# Patient Record
Sex: Female | Born: 1990
Health system: Southern US, Community
[De-identification: ages and names within clinical notes are randomized; demographics above are authoritative.]

## PROBLEM LIST (undated history)

## (undated) DIAGNOSIS — D259 Leiomyoma of uterus, unspecified: Secondary | ICD-10-CM

## (undated) DIAGNOSIS — U071 COVID-19: Secondary | ICD-10-CM

## (undated) DIAGNOSIS — R519 Headache, unspecified: Secondary | ICD-10-CM

## (undated) DIAGNOSIS — N83209 Unspecified ovarian cyst, unspecified side: Secondary | ICD-10-CM

## (undated) DIAGNOSIS — D649 Anemia, unspecified: Secondary | ICD-10-CM

## (undated) DIAGNOSIS — N7011 Chronic salpingitis: Secondary | ICD-10-CM

## (undated) DIAGNOSIS — F419 Anxiety disorder, unspecified: Secondary | ICD-10-CM

## (undated) DIAGNOSIS — R42 Dizziness and giddiness: Secondary | ICD-10-CM

## (undated) DIAGNOSIS — R102 Pelvic and perineal pain: Secondary | ICD-10-CM

## (undated) DIAGNOSIS — D563 Thalassemia minor: Secondary | ICD-10-CM

## (undated) HISTORY — PX: DILATION AND CURETTAGE OF UTERUS: SHX78

## (undated) HISTORY — PX: BREAST CYST ASPIRATION: SHX578

---
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
## 2008-04-12 ENCOUNTER — Emergency Department: Payer: Self-pay | Admitting: Emergency Medicine

## 2008-05-20 ENCOUNTER — Emergency Department: Payer: Self-pay | Admitting: Emergency Medicine

## 2008-05-30 ENCOUNTER — Encounter: Payer: Self-pay | Admitting: Internal Medicine

## 2008-06-05 ENCOUNTER — Encounter: Payer: Self-pay | Admitting: Internal Medicine

## 2008-07-03 ENCOUNTER — Encounter: Payer: Self-pay | Admitting: Internal Medicine

## 2008-08-29 ENCOUNTER — Emergency Department: Payer: Self-pay | Admitting: Emergency Medicine

## 2008-11-07 ENCOUNTER — Emergency Department: Payer: Self-pay | Admitting: Internal Medicine

## 2008-12-21 ENCOUNTER — Emergency Department: Payer: Self-pay | Admitting: Emergency Medicine

## 2009-01-08 ENCOUNTER — Emergency Department: Payer: Self-pay | Admitting: Emergency Medicine

## 2009-01-28 ENCOUNTER — Emergency Department: Payer: Self-pay | Admitting: Emergency Medicine

## 2009-06-22 ENCOUNTER — Emergency Department: Payer: Self-pay | Admitting: Emergency Medicine

## 2009-06-24 ENCOUNTER — Emergency Department: Payer: Self-pay | Admitting: Unknown Physician Specialty

## 2009-07-25 ENCOUNTER — Emergency Department: Payer: Self-pay | Admitting: Emergency Medicine

## 2009-08-16 ENCOUNTER — Ambulatory Visit: Payer: Self-pay | Admitting: Unknown Physician Specialty

## 2010-08-01 ENCOUNTER — Emergency Department: Payer: Self-pay | Admitting: Emergency Medicine

## 2012-10-25 ENCOUNTER — Observation Stay: Payer: Self-pay | Admitting: Obstetrics and Gynecology

## 2012-10-28 ENCOUNTER — Ambulatory Visit: Payer: Self-pay | Admitting: Advanced Practice Midwife

## 2013-02-08 ENCOUNTER — Inpatient Hospital Stay: Payer: Self-pay | Admitting: Obstetrics and Gynecology

## 2013-02-08 LAB — CBC WITH DIFFERENTIAL/PLATELET
Basophil %: 0.3 %
Eosinophil %: 0.9 %
HCT: 39.1 % (ref 35.0–47.0)
Lymphocyte #: 2.5 10*3/uL (ref 1.0–3.6)
MCV: 93 fL (ref 80–100)
Neutrophil %: 69.3 %
Platelet: 257 10*3/uL (ref 150–440)
RBC: 4.23 10*6/uL (ref 3.80–5.20)
RDW: 14.3 % (ref 11.5–14.5)

## 2013-02-09 LAB — HEMATOCRIT: HCT: 31.9 % — ABNORMAL LOW (ref 35.0–47.0)

## 2014-09-12 NOTE — H&P (Signed)
L&D Evaluation:  History:  HPI Pt is a 24 yo G2P0010 at 38.[redacted] weeks GA based on LMP who presents with ctx that started at midnight and awoke her from her sleep. She denies vb or lof, and reports +FM. She recieves her care from [facility] which is significant for late entry to care, obesity, trichomoniasis in pregnancy, smoker. She is O+, VI, RI, GBS+, and recieved her TDaP this pregnancy.   Presents with contractions   Patient's Medical History other  hx of chlamydia, PID   Patient's Surgical History D&C   Medications Pre Natal Vitamins   Allergies PCN, Sulfa, other, latex, pickles   Social History tobacco   Family History Non-Contributory   ROS:  ROS All systems were reviewed.  HEENT, CNS, GI, GU, Respiratory, CV, Renal and Musculoskeletal systems were found to be normal.   Exam:  Vital Signs stable   General no apparent distress   Mental Status clear   Chest clear   Heart normal sinus rhythm   Abdomen gravid, tender with contractions   Back no CVAT   Mebranes Intact   FHT normal rate with no decels, 135-140   Ucx regular, 2 min   Skin dry, no lesions   Lymph no lymphadenopathy   Impression:  Impression IUP at 38.2, R/O labor   Plan:  Plan EFM/NST, monitor contractions and for cervical change   Electronic Signatures: Jannet MantisSubudhi, Priscillia Fouch (CNM)  (Signed 07-Oct-14 02:24)  Authored: L&D Evaluation   Last Updated: 07-Oct-14 02:24 by Jannet MantisSubudhi, Rayquon Uselman (CNM)

## 2015-06-29 ENCOUNTER — Encounter: Payer: Self-pay | Admitting: Emergency Medicine

## 2015-06-29 ENCOUNTER — Emergency Department
Admission: EM | Admit: 2015-06-29 | Discharge: 2015-06-29 | Disposition: A | Discharge status: Home / Self Care | Payer: BLUE CROSS/BLUE SHIELD | Attending: Student | Admitting: Student

## 2015-06-29 ENCOUNTER — Emergency Department: Payer: BLUE CROSS/BLUE SHIELD

## 2015-06-29 DIAGNOSIS — W540XXA Bitten by dog, initial encounter: Secondary | ICD-10-CM | POA: Diagnosis not present

## 2015-06-29 DIAGNOSIS — Y998 Other external cause status: Secondary | ICD-10-CM | POA: Diagnosis not present

## 2015-06-29 DIAGNOSIS — L03021 Acute lymphangitis of right finger: Secondary | ICD-10-CM

## 2015-06-29 DIAGNOSIS — F17210 Nicotine dependence, cigarettes, uncomplicated: Secondary | ICD-10-CM | POA: Insufficient documentation

## 2015-06-29 DIAGNOSIS — Y9389 Activity, other specified: Secondary | ICD-10-CM | POA: Insufficient documentation

## 2015-06-29 DIAGNOSIS — Z880 Allergy status to penicillin: Secondary | ICD-10-CM | POA: Diagnosis not present

## 2015-06-29 DIAGNOSIS — Y9289 Other specified places as the place of occurrence of the external cause: Secondary | ICD-10-CM | POA: Diagnosis not present

## 2015-06-29 DIAGNOSIS — S61051A Open bite of right thumb without damage to nail, initial encounter: Secondary | ICD-10-CM | POA: Insufficient documentation

## 2015-06-29 MED ORDER — DOXYCYCLINE HYCLATE 100 MG PO TABS
100.0000 mg | ORAL_TABLET | Freq: Once | ORAL | Status: AC
  Administered 2015-06-29: 100 mg via ORAL
  Filled 2015-06-29: qty 1

## 2015-06-29 MED ORDER — CLINDAMYCIN HCL 300 MG PO CAPS: 300.0000 mg | ORAL_CAPSULE | Freq: Three times a day (TID) | ORAL | Status: DC

## 2015-06-29 MED ORDER — DOXYCYCLINE HYCLATE 100 MG PO CAPS: 100.0000 mg | ORAL_CAPSULE | Freq: Two times a day (BID) | ORAL | Status: DC

## 2015-06-29 MED ORDER — HYDROCODONE-ACETAMINOPHEN 5-325 MG PO TABS: 1.0000 | ORAL_TABLET | ORAL | Status: DC | PRN

## 2015-06-29 MED ORDER — CLINDAMYCIN PHOSPHATE 900 MG/6ML IJ SOLN
600.0000 mg | Freq: Once | INTRAMUSCULAR | Status: AC
  Administered 2015-06-29: 600 mg via INTRAMUSCULAR
  Filled 2015-06-29: qty 6

## 2015-06-29 NOTE — ED Notes (Signed)
Pt states that she was syringe feeding a 4 day old puppy that's tooth penetrated her rt thumb pad, pt states it occurred on wed and states that it cont to swell and hurt. Pt has some redness radiating up her rt wrist.

## 2015-06-29 NOTE — ED Provider Notes (Signed)
[facility] Emergency Department Provider Note ____________________________________________  Time seen: Approximately 4:43 PM  I have reviewed the triage vital signs and the nursing notes.   HISTORY  Chief Complaint Animal Bite   HPI NICKI FURLAN is a 25 y.o. female who presents to the emergency department for evaluation of right thumb pain and swelling. She states that she was syringe feeding a 57-day-old puppy when he accidentally hit her right thumb. The incident occurred on Wednesday and it has continued to swell and the pain has increased. She has not taken anything for pain.  History reviewed. No pertinent past medical history.  There are no active problems to display for this patient.   History reviewed. No pertinent past surgical history.  Current Outpatient Rx  Name  Route  Sig  Dispense  Refill  . clindamycin (CLEOCIN) 300 MG capsule   Oral   Take 1 capsule (300 mg total) by mouth 3 (three) times daily.   30 capsule   0   . doxycycline (VIBRAMYCIN) 100 MG capsule   Oral   Take 1 capsule (100 mg total) by mouth 2 (two) times daily.   20 capsule   0   . HYDROcodone-acetaminophen (NORCO/VICODIN) 5-325 MG tablet   Oral   Take 1 tablet by mouth every 4 (four) hours as needed for moderate pain.   20 tablet   0     Allergies Sulfa antibiotics and Penicillins  No family history on file.  Social History Social History  Substance Use Topics  . Smoking status: Current Every Day Smoker -- 0.50 packs/day    Types: Cigarettes  . Smokeless tobacco: None  . Alcohol Use: Yes     Comment: occas.     Review of Systems   Constitutional: No fever/chills Gastrointestinal:  No nausea, no vomiting.  No diarrhea. Musculoskeletal: Positive for right thumb pain Skin: Positive for swelling and erythema of the right thumb. Neurological: Negative for headaches, focal weakness or  numbness ____________________________________________   PHYSICAL EXAM:  VITAL SIGNS: ED Triage Vitals  Enc Vitals Group     BP 06/29/15 1559 120/74 mmHg     Pulse Rate 06/29/15 1559 97     Resp 06/29/15 1559 18     Temp 06/29/15 1559 98.2 F (36.8 C)     Temp Source 06/29/15 1559 Oral     SpO2 06/29/15 1559 100 %     Weight 06/29/15 1555 170 lb (77.111 kg)     Height 06/29/15 1555 5' (1.524 m)     Head Cir --      Peak Flow --      Pain Score 06/29/15 1555 0     Pain Loc --      Pain Edu? --      Excl. in GC? --     Constitutional: Alert and oriented. Well appearing and in no acute distress. Eyes: Conjunctivae are normal. PERRL. EOMI Cardiovascular: Good peripheral circulation. Thumb pad blanches. Respiratory: Normal respiratory effort.  No retractions.  Musculoskeletal: Limited movement of MCP and PIP due to swelling and pain. Neurologic:  Normal speech and language. No gross focal neurologic deficits are appreciated. Speech is normal. No gait instability. Skin: Early lymphangitis noted on the right proximal thumb; Negative for petechiae.  Psychiatric: Mood and affect are normal. Speech and behavior are normal.  ____________________________________________   LABS (all labs ordered are listed, but only abnormal results are displayed)  Labs Reviewed - No data to display ____________________________________________  EKG  ____________________________________________  RADIOLOGY  Right thumb negative for acute bony abnormality or retained foreign body per radiology. ____________________________________________   PROCEDURES  Procedure(s) performed:  ____________________________________________   INITIAL IMPRESSION / ASSESSMENT AND PLAN / ED COURSE  Pertinent labs & imaging results that were available during my care of the patient were reviewed by me and considered in my medical decision making (see chart for details).  IM Clindamycin and PO doxycycline  given while in the emergency department. Patient is PCN allergic. She was strongly advised to take the antibiotics as directed and until finished. She was advised that finger infections have poor long term outcomes if noncompliant with medications. She was advised to return to the ER if she is not improving over the next 48 hours. She was advised to return sooner if symptoms worsen. She is to follow up with orthopedics as well. Patient verbalizes understanding and agrees to the plan. ____________________________________________   FINAL CLINICAL IMPRESSION(S) / ED DIAGNOSES  Final diagnoses:  Animal bite of thumb, right, initial encounter  Acute lymphangitis of finger of right hand       Chinita Pester, FNP 06/29/15 1849  Gayla Doss, MD 06/30/15 [id number]

## 2015-06-29 NOTE — ED Notes (Signed)
Pharmacy notified to send up clindamycin

## 2015-06-29 NOTE — Discharge Instructions (Signed)
Animal Bite °Animal bites can range from mild to serious. An animal bite can result in a scratch on the skin, a deep open cut, a puncture of the skin, a crush injury, or tearing away of the skin or a body part. A small bite from a house pet will usually not cause serious problems. However, some animal bites can become infected or injure a bone or other tissue.  °Bites from certain animals can be more dangerous because of the risk of spreading rabies, which is a serious viral infection. This risk is higher with bites from stray animals or wild animals, such as raccoons, foxes, skunks, and bats. Dogs are responsible for most animal bites. Children are bitten more often than adults. °SYMPTOMS  °Common symptoms of an animal bite include:  °· Pain.   °· Bleeding.   °· Swelling.   °· Bruising.   °DIAGNOSIS  °This condition may be diagnosed based on a physical exam and medical history. Your health care provider will examine the wound and ask for details about the animal and how the bite happened. You may also have tests, such as:  °· Blood tests to check for infection or to determine if surgery is needed. °· X-rays to check for damage to bones or joints. °· Culture test. This uses a sample of fluid from the wound to check for infection. °TREATMENT  °Treatment varies depending on the location and type of animal bite and your medical history. Treatment may include:  °· Wound care. This often includes cleaning the wound, flushing the wound with saline solution, and applying a bandage (dressing). Sometimes, the wound is left open to heal because of the high risk of infection. However, in some cases, the wound may be closed with stitches (sutures), staples, skin glue, or adhesive strips.   °· Antibiotic medicine.   °· Tetanus shot.   °· Rabies treatment if the animal could have rabies.   °In some cases, bites that have become infected may require IV antibiotics and surgical treatment in the hospital.  °HOME CARE  INSTRUCTIONS °Wound Care  °· Follow instructions from your health care provider about how to take care of your wound. Make sure you: °¨ Wash your hands with soap and water before you change your dressing. If soap and water are not available, use hand sanitizer. °¨ Change your dressing as told by your health care provider. °¨ Leave sutures, skin glue, or adhesive strips in place. These skin closures may need to be in place for 2 weeks or longer. If adhesive strip edges start to loosen and curl up, you may trim the loose edges. Do not remove adhesive strips completely unless your health care provider tells you to do that. °· Check your wound every day for signs of infection. Watch for:   °¨  Increasing redness, swelling, or pain.   °¨  Fluid, blood, or pus.   °General Instructions  °· Take or apply over-the-counter and prescription medicines only as told by your health care provider.   °· If you were prescribed an antibiotic, take or apply it as told by your health care provider. Do not stop using the antibiotic even if your condition improves.   °· Keep the injured area raised (elevated) above the level of your heart while you are sitting or lying down, if this is possible.   °· If directed, apply ice to the injured area.   °¨  Put ice in a plastic bag.   °¨  Place a towel between your skin and the bag.   °¨  Leave the ice on for 20 minutes, 2-3 times per day.   °·   Keep all follow-up visits as told by your health care provider. This is important.  SEEK MEDICAL CARE IF:  You have increasing redness, swelling, or pain at the site of your wound.   You have a general feeling of sickness (malaise).   You feel nauseous or you vomit.   You have pain that does not get better.  SEEK IMMEDIATE MEDICAL CARE IF:  You have a red streak extending away from your wound.   You have fluid, blood, or pus coming from your wound.   You have a fever or chills.   You have trouble moving your injured area.   You  have numbness or tingling extending beyond the wound.   This information is not intended to replace advice given to you by your health care provider. Make sure you discuss any questions you have with your health care provider.   Document Released: 01/07/2011 Document Revised: 01/10/2015 Document Reviewed: 09/06/2014 Elsevier Interactive Patient Education 2016 [employer]  Fingertip Infection When an infection is around the nail, it is called a paronychia. When it appears over the tip of the finger, it is called a felon. These infections are due to minor injuries or cracks in the skin. If they are not treated properly, they can lead to bone infection and permanent damage to the fingernail. Incision and drainage is necessary if a pus pocket (an abscess) has formed. Antibiotics and pain medicine may also be needed. Keep your hand elevated for the next 2-3 days to reduce swelling and pain. If a pack was placed in the abscess, it should be removed in 1-2 days by your caregiver. Soak the finger in warm water for 20 minutes 4 times daily to help promote drainage. Keep the hands as dry as possible. Wear protective gloves with cotton liners. See your caregiver for follow-up care as recommended.  HOME CARE INSTRUCTIONS   Keep wound clean, dry and dressed as suggested by your caregiver.  Soak in warm salt water for fifteen minutes, four times per day for bacterial infections.  Your caregiver will prescribe an antibiotic if a bacterial infection is suspected. Take antibiotics as directed and finish the prescription, even if the problem appears to be improving before the medicine is gone.  Only take over-the-counter or prescription medicines for pain, discomfort, or fever as directed by your caregiver. SEEK IMMEDIATE MEDICAL CARE IF:  There is redness, swelling, or increasing pain in the wound.  Pus or any other unusual drainage is coming from the wound.  An unexplained oral temperature above 102 F  (38.9 C) develops.  You notice a foul smell coming from the wound or dressing. MAKE SURE YOU:   Understand these instructions.  Monitor your condition.  Contact your caregiver if you are getting worse or not improving.   This information is not intended to replace advice given to you by your health care provider. Make sure you discuss any questions you have with your health care provider.   Document Released: 05/29/2004 Document Revised: 07/14/2011 Document Reviewed: 10/09/2014 Elsevier Interactive Patient Education [employer]

## 2015-06-29 NOTE — ED Notes (Signed)
Pt states she was feeding her puppy with a syringe, puppy bit first finger on right hand, thumb appears swollen and red.

## 2015-06-29 NOTE — ED Notes (Signed)
Pt tolerated IM clindamycin without difficulty.

## 2015-07-02 ENCOUNTER — Emergency Department
Admission: EM | Admit: 2015-07-02 | Discharge: 2015-07-02 | Disposition: A | Discharge status: Home / Self Care | Payer: BLUE CROSS/BLUE SHIELD | Attending: Emergency Medicine | Admitting: Emergency Medicine

## 2015-07-02 ENCOUNTER — Encounter: Payer: Self-pay | Admitting: Emergency Medicine

## 2015-07-02 DIAGNOSIS — F17210 Nicotine dependence, cigarettes, uncomplicated: Secondary | ICD-10-CM | POA: Insufficient documentation

## 2015-07-02 DIAGNOSIS — Z79899 Other long term (current) drug therapy: Secondary | ICD-10-CM | POA: Insufficient documentation

## 2015-07-02 DIAGNOSIS — Z792 Long term (current) use of antibiotics: Secondary | ICD-10-CM | POA: Diagnosis not present

## 2015-07-02 DIAGNOSIS — W540XXA Bitten by dog, initial encounter: Secondary | ICD-10-CM

## 2015-07-02 DIAGNOSIS — Z4801 Encounter for change or removal of surgical wound dressing: Secondary | ICD-10-CM | POA: Diagnosis not present

## 2015-07-02 DIAGNOSIS — Z5189 Encounter for other specified aftercare: Secondary | ICD-10-CM

## 2015-07-02 DIAGNOSIS — W540XXD Bitten by dog, subsequent encounter: Secondary | ICD-10-CM | POA: Diagnosis not present

## 2015-07-02 DIAGNOSIS — Z880 Allergy status to penicillin: Secondary | ICD-10-CM | POA: Diagnosis not present

## 2015-07-02 DIAGNOSIS — S61051D Open bite of right thumb without damage to nail, subsequent encounter: Secondary | ICD-10-CM | POA: Diagnosis present

## 2015-07-02 NOTE — Discharge Instructions (Signed)

## 2015-07-02 NOTE — ED Notes (Signed)
Thumb appears infected x 5 days, tried to drain with needle herself yesterday.

## 2015-07-02 NOTE — ED Provider Notes (Signed)
[facility] Emergency Department Provider Note  ____________________________________________  Time seen: Approximately 1:04 PM  I have reviewed the triage vital signs and the nursing notes.   HISTORY  Chief Complaint Finger Injury    HPI Virginia Mitchell is a 25 y.o. female who presents with swelling of right thumb secondary to a dog bite. The patient got bit on Wednesday and was seen in the ED on 06/30/15. She was given oral clindamycin and doxycycline at that time. The patient has been taking the medications appropriately. She noticed increased swelling and erythema starting last night. She states that the swelling and tenderness is now extending to the proximal aspect of her thumb. She denies radiation of pain to the wrist and fingers. The patient describes the pain as a constant throbbing and ranks the severity to be a 7/10. She poked the site with a needle which released pus. The patient also notes associated tingling and itching when the area is touched. She denies nausea, vomiting, fever and chills. She only took percocet twice but has been instead taking ibuprofen with minimal relief.  History reviewed. No pertinent past medical history.  There are no active problems to display for this patient.   History reviewed. No pertinent past surgical history.  Current Outpatient Rx  Name  Route  Sig  Dispense  Refill  . clindamycin (CLEOCIN) 300 MG capsule   Oral   Take 300 mg by mouth 3 (three) times daily.         . clindamycin (CLEOCIN) 300 MG capsule   Oral   Take 1 capsule (300 mg total) by mouth 3 (three) times daily.   30 capsule   0   . doxycycline (VIBRAMYCIN) 100 MG capsule   Oral   Take 1 capsule (100 mg total) by mouth 2 (two) times daily.   20 capsule   0   . HYDROcodone-acetaminophen (NORCO/VICODIN) 5-325 MG tablet   Oral   Take 1 tablet by mouth every 4 (four) hours as needed for moderate pain.   20 tablet   0      Allergies Sulfa antibiotics and Penicillins  No family history on file.  Social History Social History  Substance Use Topics  . Smoking status: Current Every Day Smoker -- 0.50 packs/day    Types: Cigarettes  . Smokeless tobacco: None  . Alcohol Use: Yes     Comment: occas.     Review of Systems Constitutional: No fever/chills Gastrointestinal: No abdominal pain.  No nausea, no vomiting.  No diarrhea.  Musculoskeletal: Right thumb pain Skin: Swelling of thumb Neurological: Negative for headaches, focal weakness or numbness. 10-point ROS otherwise negative.  ____________________________________________   PHYSICAL EXAM:  VITAL SIGNS: ED Triage Vitals  Enc Vitals Group     BP 07/02/15 1055 124/79 mmHg     Pulse Rate 07/02/15 1055 83     Resp 07/02/15 1055 18     Temp 07/02/15 1055 98.3 F (36.8 C)     Temp Source 07/02/15 1055 Oral     SpO2 07/02/15 1055 99 %     Weight 07/02/15 1055 170 lb (77.111 kg)     Height 07/02/15 1055 5' (1.524 m)     Head Cir --      Peak Flow --      Pain Score 07/02/15 1056 5     Pain Loc --      Pain Edu? --      Excl. in GC? --  Constitutional: Alert and oriented. Well appearing and in no acute distress. Head: Atraumatic. Cardiovascular: Normal rate, regular rhythm. Grossly normal heart sounds.   Respiratory: Normal respiratory effort.  No retractions. Lungs CTAB. Musculoskeletal: Limited ROM of right first MCP and PIP due to swelling. Capillary refill 1-2 sec. Tingling with palpation of thumb.  Neurologic:  Normal speech and language. No gross focal neurologic deficits are appreciated. No gait instability. Skin:  Erythema and swelling of right thumb Psychiatric: Mood and affect are normal. Speech and behavior are normal.  ____________________________________________   LABS (all labs ordered are listed, but only abnormal results are displayed)  Labs Reviewed - No data to  display ____________________________________________  EKG   ____________________________________________  RADIOLOGY   ____________________________________________   PROCEDURES  Procedure(s) performed: None  Critical Care performed: No  ____________________________________________   INITIAL IMPRESSION / ASSESSMENT AND PLAN / ED COURSE  Pertinent labs & imaging results that were available during my care of the patient were reviewed by me and considered in my medical decision making (see chart for details).  Patient presents to emergency department for recheck of a dog bite to her right thumb. Patient is on doxycycline and clindamycin for same. She has a allergy to penicillin based antibiotics. Patient states that symptoms have been maintaining since her last visit. She still endorses some erythema, edema, and pain to the digit. She denies that it was worsening but states that it did not appear to be vastly improving on antibiotics. While the patient was here she noticed an area that "felt like fluid underneath". On further provider she "picked at it with her fingernail" and ruptured the skin. She expressed a minimal amount of purulent material. She states that the pain as well as the swelling appears to improve here in the emergency department. Due to this, no further antibiotics and no change in antibiotics is warranted. Patient's exam is reassuring. There is no firmness or fluctuance noted to palpation at this time. Patient is given strict ED precautions to return for any worsening of symptoms. Patient verbalizes understanding of treatment plan and verbalizes compliance with same. ____________________________________________   FINAL CLINICAL IMPRESSION(S) / ED DIAGNOSES  Final diagnoses:  Visit for wound check  Dog bite      Racheal Patches, PA-C 07/02/15 1509  Sharman Cheek, MD 07/02/15 [phone]

## 2015-07-02 NOTE — ED Notes (Signed)
Right thumb swollen and tender.. Was seen on 2/24 for same ..placed on antibiotics at that time

## 2015-07-10 ENCOUNTER — Emergency Department
Admission: EM | Admit: 2015-07-10 | Discharge: 2015-07-10 | Disposition: A | Discharge status: Home / Self Care | Payer: BLUE CROSS/BLUE SHIELD | Attending: Emergency Medicine | Admitting: Emergency Medicine

## 2015-07-10 ENCOUNTER — Encounter: Payer: Self-pay | Admitting: Emergency Medicine

## 2015-07-10 DIAGNOSIS — Z792 Long term (current) use of antibiotics: Secondary | ICD-10-CM | POA: Diagnosis not present

## 2015-07-10 DIAGNOSIS — F17210 Nicotine dependence, cigarettes, uncomplicated: Secondary | ICD-10-CM | POA: Insufficient documentation

## 2015-07-10 DIAGNOSIS — R05 Cough: Secondary | ICD-10-CM | POA: Diagnosis present

## 2015-07-10 DIAGNOSIS — Z880 Allergy status to penicillin: Secondary | ICD-10-CM | POA: Diagnosis not present

## 2015-07-10 DIAGNOSIS — B349 Viral infection, unspecified: Secondary | ICD-10-CM | POA: Diagnosis not present

## 2015-07-10 NOTE — Discharge Instructions (Signed)
1. Alternate Tylenol and Motrin every 4 hours as needed for fever greater than 100.85F. 2. Drink plenty of fluids daily. 3. Return to the ER for worsened symptoms, persistent vomiting, difficulty breathing or other concerns.  Viral Infections A viral infection can be caused by different types of viruses.Most viral infections are not serious and resolve on their own. However, some infections may cause severe symptoms and may lead to further complications. SYMPTOMS Viruses can frequently cause:  Minor sore throat.  Aches and pains.  Headaches.  Runny nose.  Different types of rashes.  Watery eyes.  Tiredness.  Cough.  Loss of appetite.  Gastrointestinal infections, resulting in nausea, vomiting, and diarrhea. These symptoms do not respond to antibiotics because the infection is not caused by bacteria. However, you might catch a bacterial infection following the viral infection. This is sometimes called a "superinfection." Symptoms of such a bacterial infection may include:  Worsening sore throat with pus and difficulty swallowing.  Swollen neck glands.  Chills and a high or persistent fever.  Severe headache.  Tenderness over the sinuses.  Persistent overall ill feeling (malaise), muscle aches, and tiredness (fatigue).  Persistent cough.  Yellow, green, or brown mucus production with coughing. HOME CARE INSTRUCTIONS   Only take over-the-counter or prescription medicines for pain, discomfort, diarrhea, or fever as directed by your caregiver.  Drink enough water and fluids to keep your urine clear or pale yellow. Sports drinks can provide valuable electrolytes, sugars, and hydration.  Get plenty of rest and maintain proper nutrition. Soups and broths with crackers or rice are fine. SEEK IMMEDIATE MEDICAL CARE IF:   You have severe headaches, shortness of breath, chest pain, neck pain, or an unusual rash.  You have uncontrolled vomiting, diarrhea, or you are unable  to keep down fluids.  You or your child has an oral temperature above 102 F (38.9 C), not controlled by medicine.  Your baby is older than 3 months with a rectal temperature of 102 F (38.9 C) or higher.  Your baby is 723 months old or younger with a rectal temperature of 100.4 F (38 C) or higher. MAKE SURE YOU:   Understand these instructions.  Will watch your condition.  Will get help right away if you are not doing well or get worse.   This information is not intended to replace advice given to you by your health care provider. Make sure you discuss any questions you have with your health care provider.   Document Released: 01/29/2005 Document Revised: 07/14/2011 Document Reviewed: 09/27/2014 Elsevier Interactive Patient Education [employer]

## 2015-07-10 NOTE — ED Provider Notes (Signed)
[facility] Emergency Department Provider Note  ____________________________________________  Time seen: Approximately 6:14 AM  I have reviewed the triage vital signs and the nursing notes.   HISTORY  Chief Complaint Cough and Nasal Congestion    HPI Virginia Mitchell is a 25 y.o. female who presents to the ED from home with a chief complaint of fever, cough, congestion. She reports onset of symptoms 2 days ago with cough productive of slightly yellow sputum, nasal congestion, fever. Her toddler-aged son is also a patient in the ED with similar symptoms.Denies associated chest pain, shortness of breath, abdominal pain, nausea, vomiting, diarrhea. Denies recent travel or trauma. Nothing makes her symptoms better or worse.   Past medical history None  There are no active problems to display for this patient.   History reviewed. No pertinent past surgical history.  Current Outpatient Rx  Name  Route  Sig  Dispense  Refill  . clindamycin (CLEOCIN) 300 MG capsule   Oral   Take 1 capsule (300 mg total) by mouth 3 (three) times daily.   30 capsule   0   . clindamycin (CLEOCIN) 300 MG capsule   Oral   Take 300 mg by mouth 3 (three) times daily.         Marland Kitchen doxycycline (VIBRAMYCIN) 100 MG capsule   Oral   Take 1 capsule (100 mg total) by mouth 2 (two) times daily.   20 capsule   0   . HYDROcodone-acetaminophen (NORCO/VICODIN) 5-325 MG tablet   Oral   Take 1 tablet by mouth every 4 (four) hours as needed for moderate pain.   20 tablet   0     Allergies Sulfa antibiotics and Penicillins  No family history on file.  Social History Social History  Substance Use Topics  . Smoking status: Current Every Day Smoker -- 0.50 packs/day    Types: Cigarettes  . Smokeless tobacco: None  . Alcohol Use: Yes     Comment: occas.     Review of Systems  Constitutional: Positive for fever/chills Eyes: No visual changes. ENT: Positive for nasal  congestion. No sore throat. Cardiovascular: Denies chest pain. Respiratory: Positive for productive cough. Denies shortness of breath. Gastrointestinal: No abdominal pain.  No nausea, no vomiting.  No diarrhea.  No constipation. Genitourinary: Negative for dysuria. Musculoskeletal: Negative for back pain. Skin: Negative for rash. Neurological: Negative for headaches, focal weakness or numbness.  10-point ROS otherwise negative.  ____________________________________________   PHYSICAL EXAM:  VITAL SIGNS: ED Triage Vitals  Enc Vitals Group     BP 07/10/15 0447 115/70 mmHg     Pulse Rate 07/10/15 0447 101     Resp 07/10/15 0447 20     Temp 07/10/15 0447 98.2 F (36.8 C)     Temp Source 07/10/15 0447 Oral     SpO2 07/10/15 0447 95 %     Weight 07/10/15 0447 160 lb (72.576 kg)     Height 07/10/15 0447 5' (1.524 m)     Head Cir --      Peak Flow --      Pain Score --      Pain Loc --      Pain Edu? --      Excl. in GC? --     Constitutional: Alert and oriented. Well appearing and in no acute distress. Eyes: Conjunctivae are normal. PERRL. EOMI. Head: Atraumatic. Nose: Congestion/rhinnorhea. Mouth/Throat: Mucous membranes are moist.  Oropharynx non-erythematous. Neck: No stridor.  Supple neck without meningismus.  Hematological/Lymphatic/Immunilogical: No cervical lymphadenopathy. Cardiovascular: Normal rate, regular rhythm. Grossly normal heart sounds.  Good peripheral circulation. Respiratory: Normal respiratory effort.  No retractions. Lungs CTAB. Gastrointestinal: Soft and nontender. No distention. No abdominal bruits. No CVA tenderness. Musculoskeletal: No lower extremity tenderness nor edema.  No joint effusions. Neurologic:  Normal speech and language. No gross focal neurologic deficits are appreciated. No gait instability. Skin:  Skin is warm, dry and intact. No rash noted. No petechiae. Psychiatric: Mood and affect are normal. Speech and behavior are  normal.  ____________________________________________   LABS (all labs ordered are listed, but only abnormal results are displayed)  Labs Reviewed - No data to display ____________________________________________  EKG  None ____________________________________________  RADIOLOGY  None ____________________________________________   PROCEDURES  Procedure(s) performed: None  Critical Care performed: No  ____________________________________________   INITIAL IMPRESSION / ASSESSMENT AND PLAN / ED COURSE  Pertinent labs & imaging results that were available during my care of the patient were reviewed by me and considered in my medical decision making (see chart for details).  25 year old female who presents with fever, cough, congestion; son with similar symptoms. Son is being tested for influenza. If positive, will start patient on Tamiflu along with her son.  ----------------------------------------- 7:05 AM on 07/10/2015 -----------------------------------------  Son tested negative for influenza A and B. Advised antipyretics as needed for fever and hydration. Strict return precautions given. Patient verbalizes understanding and agrees with plan of care. ____________________________________________   FINAL CLINICAL IMPRESSION(S) / ED DIAGNOSES  Final diagnoses:  Viral syndrome      [name], MD 07/10/15 [id number]

## 2015-07-10 NOTE — ED Notes (Signed)
Patient ambulatory to triage with steady gait, without difficulty or distress noted; pt reports cough/congestion, fever since Saturday; son checking in with same symptoms

## 2016-07-21 ENCOUNTER — Encounter: Payer: Self-pay | Admitting: *Deleted

## 2016-07-21 ENCOUNTER — Emergency Department
Admission: EM | Admit: 2016-07-21 | Discharge: 2016-07-21 | Disposition: A | Discharge status: Home / Self Care | Payer: BLUE CROSS/BLUE SHIELD | Attending: Emergency Medicine | Admitting: Emergency Medicine

## 2016-07-21 DIAGNOSIS — F17210 Nicotine dependence, cigarettes, uncomplicated: Secondary | ICD-10-CM | POA: Diagnosis not present

## 2016-07-21 DIAGNOSIS — N921 Excessive and frequent menstruation with irregular cycle: Secondary | ICD-10-CM | POA: Diagnosis not present

## 2016-07-21 DIAGNOSIS — Z79899 Other long term (current) drug therapy: Secondary | ICD-10-CM | POA: Diagnosis not present

## 2016-07-21 DIAGNOSIS — N939 Abnormal uterine and vaginal bleeding, unspecified: Secondary | ICD-10-CM | POA: Diagnosis present

## 2016-07-21 HISTORY — DX: Anemia, unspecified: D64.9

## 2016-07-21 LAB — POCT PREGNANCY, URINE: Preg Test, Ur: NEGATIVE

## 2016-07-21 LAB — CBC
HEMATOCRIT: 39.4 % (ref 35.0–47.0)
Hemoglobin: 13.2 g/dL (ref 12.0–16.0)
MCH: 30.1 pg (ref 26.0–34.0)
MCHC: 33.5 g/dL (ref 32.0–36.0)
MCV: 89.6 fL (ref 80.0–100.0)
PLATELETS: 396 10*3/uL (ref 150–440)
RBC: 4.4 MIL/uL (ref 3.80–5.20)
RDW: 13.1 % (ref 11.5–14.5)
WBC: 13.7 10*3/uL — AB (ref 3.6–11.0)

## 2016-07-21 MED ORDER — MEDROXYPROGESTERONE ACETATE 10 MG PO TABS: 20.0000 mg | ORAL_TABLET | Freq: Every day | ORAL | 0 refills | Status: DC

## 2016-07-21 NOTE — ED Triage Notes (Signed)
States vaginal bleeding since December, states she has been receiving the depo shot, states Saturday she took a pregnancy test and thought it looked positive, awake and alert in no acute distress

## 2016-07-21 NOTE — ED Provider Notes (Signed)
[facility] Emergency Department Provider Note  ____________________________________________  Time seen: Approximately 4:48 PM  I have reviewed the triage vital signs and the nursing notes.   HISTORY  Chief Complaint Vaginal Bleeding    HPI Virginia Mitchell is a 26 y.o. female who complains of vaginal bleeding for the past 3 or 4 months. It all started about one month after she got her first Depo-Provera injection. The bleeding has continued ever since, she is now had 3 injections. [facility] of the [facility]. Denies any chest pain shortness breath or fever, no dysuria frequency urgency. Did a pregnancy test at home and was concerned it might be positive although she wasn't sure how to interpret because the lines were very faint. No history of fibroids. No trauma. Denies pain. Bleeding is light, using 2-3 pads a day.  No fatigue lightheadedness or syncope   Past Medical History:  Diagnosis Date  . Anemia      There are no active problems to display for this patient.    History reviewed. No pertinent surgical history.   Prior to Admission medications   Medication Sig Start Date End Date Taking? Authorizing Provider  clindamycin (CLEOCIN) 300 MG capsule Take 1 capsule (300 mg total) by mouth 3 (three) times daily. 06/29/15   Chinita Pester, FNP  clindamycin (CLEOCIN) 300 MG capsule Take 300 mg by mouth 3 (three) times daily.    Historical Provider, MD  doxycycline (VIBRAMYCIN) 100 MG capsule Take 1 capsule (100 mg total) by mouth 2 (two) times daily. 06/29/15   Chinita Pester, FNP  HYDROcodone-acetaminophen (NORCO/VICODIN) 5-325 MG tablet Take 1 tablet by mouth every 4 (four) hours as needed for moderate pain. 06/29/15   Chinita Pester, FNP  medroxyPROGESTERone (PROVERA) 10 MG tablet Take 2 tablets (20 mg total) by mouth daily. 07/21/16   Sharman Cheek, MD     Allergies Sulfa antibiotics and Penicillins   No family history on file.  Social  History Social History  Substance Use Topics  . Smoking status: Current Every Day Smoker    Packs/day: 0.50    Types: Cigarettes  . Smokeless tobacco: Never Used  . Alcohol use Yes     Comment: occas.     Review of Systems  Constitutional:   No fever or chills.  ENT:   No sore throat. No rhinorrhea. Cardiovascular:   No chest pain. Respiratory:   No dyspnea or cough. Gastrointestinal:   Negative for abdominal pain, vomiting and diarrhea.  Genitourinary:   Negative for dysuria or difficulty urinating. Musculoskeletal:   Negative for focal pain or swelling Neurological:   Negative for headaches 10-point ROS otherwise negative.  ____________________________________________   PHYSICAL EXAM:  VITAL SIGNS: ED Triage Vitals [07/21/16 1320]  Enc Vitals Group     BP 132/81     Pulse Rate 90     Resp 18     Temp 98.4 F (36.9 C)     Temp Source Oral     SpO2 100 %     Weight 175 lb (79.4 kg)     Height 5' (1.524 m)     Head Circumference      Peak Flow      Pain Score      Pain Loc      Pain Edu?      Excl. in GC?     Vital signs reviewed, nursing assessments reviewed.   Constitutional:   Alert and oriented. Well appearing and in no  distress. Eyes:   No scleral icterus. No conjunctival pallor. PERRL. EOMI.  No nystagmus. ENT   Head:   Normocephalic and atraumatic.   Nose:   No congestion/rhinnorhea. No septal hematoma   Mouth/Throat:   MMM, no pharyngeal erythema. No peritonsillar mass.    Neck:   No stridor. No SubQ emphysema. No meningismus. Hematological/Lymphatic/Immunilogical:   No cervical lymphadenopathy. Cardiovascular:   RRR. Symmetric bilateral radial and DP pulses.  No murmurs.  Respiratory:   Normal respiratory effort without tachypnea nor retractions. Breath sounds are clear and equal bilaterally. No wheezes/rales/rhonchi. Gastrointestinal:   Soft and nontender. Non distended. There is no CVA tenderness.  No rebound, rigidity, or  guarding. Genitourinary:   deferred Musculoskeletal:   Normal range of motion in all extremities. No joint effusions.  No lower extremity tenderness.  No edema. Neurologic:   Normal speech and language.  CN 2-10 normal. Motor grossly intact. No gross focal neurologic deficits are appreciated.  Skin:    Skin is warm, dry and intact. No rash noted.  No petechiae, purpura, or bullae.  ____________________________________________    LABS (pertinent positives/negatives) (all labs ordered are listed, but only abnormal results are displayed) Labs Reviewed  CBC - Abnormal; Notable for the following:       Result Value   WBC 13.7 (*)    All other components within normal limits  POCT PREGNANCY, URINE  POC URINE PREG, ED   ____________________________________________   EKG    ____________________________________________    RADIOLOGY  No results found.  ____________________________________________   PROCEDURES Procedures  ____________________________________________   INITIAL IMPRESSION / ASSESSMENT AND PLAN / ED COURSE  Pertinent labs & imaging results that were available during my care of the patient were reviewed by me and considered in my medical decision making (see chart for details).  Patient well appearing no acute distress, presents with chronic metrorrhagia related to her Depo-Provera use. For this I think she should try higher dose pulse of Provera for 1 week, and then see how her symptoms do and follow-up with gynecology for further assessment. She is also concerned that she could be pregnant. She brings a picture of her home pregnancy test on her phone, which looks like it is negative. Our test here is also negative. Low suspicion for STI PID TOA torsion. No evidence of any other intra-abdominal process. No hemorrhage or symptomatically anemia. She is hemodynamically stable with normal vital signs. Suitable for outpatient follow-up. Return precautions  given..         ____________________________________________   FINAL CLINICAL IMPRESSION(S) / ED DIAGNOSES  Final diagnoses:  Metrorrhagia      New Prescriptions   MEDROXYPROGESTERONE (PROVERA) 10 MG TABLET    Take 2 tablets (20 mg total) by mouth daily.     Portions of this note were generated with dragon dictation software. Dictation errors may occur despite best attempts at proofreading.    Sharman CheekPhillip Kerryn Tennant, MD 07/21/16 [phone]

## 2016-08-11 ENCOUNTER — Encounter: Payer: BLUE CROSS/BLUE SHIELD | Admitting: Obstetrics and Gynecology

## 2016-08-11 ENCOUNTER — Ambulatory Visit (INDEPENDENT_AMBULATORY_CARE_PROVIDER_SITE_OTHER): Payer: BLUE CROSS/BLUE SHIELD | Admitting: Obstetrics and Gynecology

## 2016-08-11 ENCOUNTER — Encounter: Payer: Self-pay | Admitting: Obstetrics and Gynecology

## 2016-08-11 VITALS — BP 94/63 | HR 98 | Ht 60.0 in | Wt 177.0 lb

## 2016-08-11 DIAGNOSIS — Z3009 Encounter for other general counseling and advice on contraception: Secondary | ICD-10-CM

## 2016-08-11 DIAGNOSIS — N921 Excessive and frequent menstruation with irregular cycle: Secondary | ICD-10-CM | POA: Diagnosis not present

## 2016-08-11 NOTE — Progress Notes (Signed)
HPI:      [name] is a 26 y.o. [id number] who LMP was No LMP recorded. Patient has had an injection.  Subjective:   She presents today After having breakthrough bleeding with Depo-Provera. She has received 3 shots of Depo-Provera and is unhappy with it because she has bled for the last few months spotting almost daily. She has been to the emergency department for this problem. She would like to consider other forms of birth control.    Hx: The following portions of the patient's history were reviewed and updated as appropriate:              She  has a past medical history of Anemia. She  does not have a problem list on file. She  has a past surgical history that includes Dilation and curettage of uterus. Her family history includes Asthma in her paternal grandmother; Diabetes in her paternal grandmother; Migraines in her paternal grandmother. She  reports that she has been smoking Cigarettes.  She has been smoking about 0.50 packs per day. She uses smokeless tobacco. She reports that she drinks alcohol. She reports that she does not use drugs. Current Outpatient Prescriptions on File Prior to Visit  Medication Sig Dispense Refill  . clindamycin (CLEOCIN) 300 MG capsule Take 1 capsule (300 mg total) by mouth 3 (three) times daily. (Patient not taking: Reported on 08/11/2016) 30 capsule 0  . clindamycin (CLEOCIN) 300 MG capsule Take 300 mg by mouth 3 (three) times daily.    Marland Kitchen doxycycline (VIBRAMYCIN) 100 MG capsule Take 1 capsule (100 mg total) by mouth 2 (two) times daily. (Patient not taking: Reported on 08/11/2016) 20 capsule 0  . HYDROcodone-acetaminophen (NORCO/VICODIN) 5-325 MG tablet Take 1 tablet by mouth every 4 (four) hours as needed for moderate pain. (Patient not taking: Reported on 08/11/2016) 20 tablet 0  . medroxyPROGESTERone (PROVERA) 10 MG tablet Take 2 tablets (20 mg total) by mouth daily. (Patient not taking: Reported on 08/11/2016) 14 tablet 0   No current  facility-administered medications on file prior to visit.          Review of Systems:  Review of Systems  Constitutional: Denied constitutional symptoms, night sweats, recent illness, fatigue, fever, insomnia and weight loss.  Eyes: Denied eye symptoms, eye pain, photophobia, vision change and visual disturbance.  Ears/Nose/Throat/Neck: Denied ear, nose, throat or neck symptoms, hearing loss, nasal discharge, sinus congestion and sore throat.  Cardiovascular: Denied cardiovascular symptoms, arrhythmia, chest pain/pressure, edema, exercise intolerance, orthopnea and palpitations.  Respiratory: Denied pulmonary symptoms, asthma, pleuritic pain, productive sputum, cough, dyspnea and wheezing.  Gastrointestinal: Denied, gastro-esophageal reflux, melena, nausea and vomiting.  Genitourinary: See HPI for additional information.  Musculoskeletal: Denied musculoskeletal symptoms, stiffness, swelling, muscle weakness and myalgia.  Dermatologic: Denied dermatology symptoms, rash and scar.  Neurologic: Denied neurology symptoms, dizziness, headache, neck pain and syncope.  Psychiatric: Denied psychiatric symptoms, anxiety and depression.  Endocrine: Denied endocrine symptoms including hot flashes and night sweats.   Meds:   Current Outpatient Prescriptions on File Prior to Visit  Medication Sig Dispense Refill  . clindamycin (CLEOCIN) 300 MG capsule Take 1 capsule (300 mg total) by mouth 3 (three) times daily. (Patient not taking: Reported on 08/11/2016) 30 capsule 0  . clindamycin (CLEOCIN) 300 MG capsule Take 300 mg by mouth 3 (three) times daily.    Marland Kitchen doxycycline (VIBRAMYCIN) 100 MG capsule Take 1 capsule (100 mg total) by mouth 2 (two) times daily. (Patient not taking: Reported on 08/11/2016)  20 capsule 0  . HYDROcodone-acetaminophen (NORCO/VICODIN) 5-325 MG tablet Take 1 tablet by mouth every 4 (four) hours as needed for moderate pain. (Patient not taking: Reported on 08/11/2016) 20 tablet 0  .  medroxyPROGESTERone (PROVERA) 10 MG tablet Take 2 tablets (20 mg total) by mouth daily. (Patient not taking: Reported on 08/11/2016) 14 tablet 0   No current facility-administered medications on file prior to visit.     Objective:     Vitals:   08/11/16 1533  BP: 94/63  Pulse: 98                Assessment:    [id number] There are no active problems to display for this patient.    1. Breakthrough bleeding on depo provera   2. Birth control counseling     Likely progesterone breakthrough bleeding from a diminished endometrial lining.   Plan:            1.  Birth Control I discussed multiple birth control options and methods with the patient.  The risks and benefits of each were reviewed. 2.  We have specifically discussed birth control methods at we'll give her better cycle control. She has chosen to try the IUD next. As her Depo expires in a week we will insert her IUD very soon thereafter. Literature given.       F/U  Return in about 2 weeks (around 08/25/2016). I spent 31 minutes with this patient of which greater than 50% was spent discussing birth control methods, side effect profile of Depo-Provera, management of breakthrough bleeding future workup if necessary.  Elonda Husky, M.D. 08/11/2016 4:39 PM

## 2016-08-12 ENCOUNTER — Encounter: Payer: BLUE CROSS/BLUE SHIELD | Admitting: Obstetrics and Gynecology

## 2016-08-14 ENCOUNTER — Other Ambulatory Visit: Payer: BLUE CROSS/BLUE SHIELD

## 2016-08-26 ENCOUNTER — Ambulatory Visit (INDEPENDENT_AMBULATORY_CARE_PROVIDER_SITE_OTHER): Payer: BLUE CROSS/BLUE SHIELD | Admitting: Obstetrics and Gynecology

## 2016-08-26 ENCOUNTER — Encounter: Payer: Self-pay | Admitting: Obstetrics and Gynecology

## 2016-08-26 VITALS — BP 100/65 | HR 88 | Ht 60.0 in | Wt 172.6 lb

## 2016-08-26 DIAGNOSIS — Z30430 Encounter for insertion of intrauterine contraceptive device: Secondary | ICD-10-CM | POA: Diagnosis not present

## 2016-08-26 DIAGNOSIS — N921 Excessive and frequent menstruation with irregular cycle: Secondary | ICD-10-CM

## 2016-08-26 NOTE — Progress Notes (Signed)
HPI:      [name] is a 26 y.o. [id number] who LMP was No LMP recorded. Patient has had an injection.  Subjective:   She presents today for IUD insertion.  Prior to this she was experiencing breakthrough bleeding with Depo-Provera.    Hx: The following portions of the patient's history were reviewed and updated as appropriate:              She  has a past medical history of Anemia. She  does not have a problem list on file. She  has a past surgical history that includes Dilation and curettage of uterus. Her family history includes Asthma in her paternal grandmother; Diabetes in her paternal grandmother; Migraines in her paternal grandmother. She  reports that she has been smoking Cigarettes.  She has been smoking about 0.50 packs per day. She uses smokeless tobacco. She reports that she drinks alcohol. She reports that she does not use drugs. No current outpatient prescriptions on file prior to visit.   No current facility-administered medications on file prior to visit.          Review of Systems:  Review of Systems  Constitutional: Denied constitutional symptoms, night sweats, recent illness, fatigue, fever, insomnia and weight loss.  Eyes: Denied eye symptoms, eye pain, photophobia, vision change and visual disturbance.  Ears/Nose/Throat/Neck: Denied ear, nose, throat or neck symptoms, hearing loss, nasal discharge, sinus congestion and sore throat.  Cardiovascular: Denied cardiovascular symptoms, arrhythmia, chest pain/pressure, edema, exercise intolerance, orthopnea and palpitations.  Respiratory: Denied pulmonary symptoms, asthma, pleuritic pain, productive sputum, cough, dyspnea and wheezing.  Gastrointestinal: Denied, gastro-esophageal reflux, melena, nausea and vomiting.  Genitourinary: Denied genitourinary symptoms including symptomatic vaginal discharge, pelvic relaxation issues, and urinary complaints.  Musculoskeletal: Denied musculoskeletal symptoms, stiffness,  swelling, muscle weakness and myalgia.  Dermatologic: Denied dermatology symptoms, rash and scar.  Neurologic: Denied neurology symptoms, dizziness, headache, neck pain and syncope.  Psychiatric: Denied psychiatric symptoms, anxiety and depression.  Endocrine: Denied endocrine symptoms including hot flashes and night sweats.   Meds:   No current outpatient prescriptions on file prior to visit.   No current facility-administered medications on file prior to visit.     Objective:     Vitals:   08/26/16 0851  BP: 100/65  Pulse: 88    Physical examination   Pelvic:   Vulva: Normal appearance.  No lesions.  Vagina: No lesions or abnormalities noted.  Support: Normal pelvic support.  Urethra No masses tenderness or scarring.  Meatus Normal size without lesions or prolapse.  Cervix: Normal appearance.  No lesions.  Anus: Normal exam.  No lesions.  Perineum: Normal exam.  No lesions.        Bimanual   Uterus: Normal size.  Non-tender.  Mobile.  retroverted  Adnexae: No masses.  Non-tender to palpation.  Cul-de-sac: Negative for abnormality.   IUD Procedure Pt has read the booklet and signed the appropriate forms regarding the Mirena IUD.  All of her questions have been answered.   The cervix was cleansed with betadine solution.  After sounding the uterus and noting the position, the IUD was placed in the usual manner without problem.  The string was cut to the appropriate length.  The patient tolerated the procedure well.              Assessment:    [id number] There are no active problems to display for this patient.    1. Encounter for insertion of mirena IUD  2. Breakthrough bleeding on depo provera       Plan:             F/U  Return in about 4 weeks (around 09/23/2016).  Elonda Husky, M.D. 08/26/2016 9:32 AM

## 2016-09-23 ENCOUNTER — Ambulatory Visit (INDEPENDENT_AMBULATORY_CARE_PROVIDER_SITE_OTHER): Payer: BLUE CROSS/BLUE SHIELD | Admitting: Obstetrics and Gynecology

## 2016-09-23 ENCOUNTER — Encounter: Payer: Self-pay | Admitting: Obstetrics and Gynecology

## 2016-09-23 VITALS — BP 103/66 | HR 80 | Wt 172.6 lb

## 2016-09-23 DIAGNOSIS — Z30431 Encounter for routine checking of intrauterine contraceptive device: Secondary | ICD-10-CM | POA: Diagnosis not present

## 2016-09-23 DIAGNOSIS — Z975 Presence of (intrauterine) contraceptive device: Secondary | ICD-10-CM | POA: Diagnosis not present

## 2016-09-23 DIAGNOSIS — N921 Excessive and frequent menstruation with irregular cycle: Secondary | ICD-10-CM | POA: Diagnosis not present

## 2016-09-23 NOTE — Progress Notes (Signed)
HPI:      [name] is a 26 y.o. [phone] who LMP was No LMP recorded. Patient is not currently having periods (Reason: IUD).  Subjective:   She presents today  For follow-up of her IUD. She reports that she is having daily spotting. She has not had intercourse. She reports no other problems.    Hx: The following portions of the patient's history were reviewed and updated as appropriate:             She  has a past medical history of Anemia. She  does not have a problem list on file. She  has a past surgical history that includes Dilation and curettage of uterus. Her family history includes Asthma in her paternal grandmother; Diabetes in her paternal grandmother; Migraines in her paternal grandmother. She  reports that she has been smoking Cigarettes.  She has been smoking about 0.50 packs per day. She uses smokeless tobacco. She reports that she drinks alcohol. She reports that she does not use drugs. She is allergic to sulfa antibiotics and penicillins.       Review of Systems:  Review of Systems  Constitutional: Denied constitutional symptoms, night sweats, recent illness, fatigue, fever, insomnia and weight loss.  Eyes: Denied eye symptoms, eye pain, photophobia, vision change and visual disturbance.  Ears/Nose/Throat/Neck: Denied ear, nose, throat or neck symptoms, hearing loss, nasal discharge, sinus congestion and sore throat.  Cardiovascular: Denied cardiovascular symptoms, arrhythmia, chest pain/pressure, edema, exercise intolerance, orthopnea and palpitations.  Respiratory: Denied pulmonary symptoms, asthma, pleuritic pain, productive sputum, cough, dyspnea and wheezing.  Gastrointestinal: Denied, gastro-esophageal reflux, melena, nausea and vomiting.  Genitourinary: See HPI for additional information.  Musculoskeletal: Denied musculoskeletal symptoms, stiffness, swelling, muscle weakness and myalgia.  Dermatologic: Denied dermatology symptoms, rash and scar.   Neurologic: Denied neurology symptoms, dizziness, headache, neck pain and syncope.  Psychiatric: Denied psychiatric symptoms, anxiety and depression.  Endocrine: Denied endocrine symptoms including hot flashes and night sweats.   Meds:   No current outpatient prescriptions on file prior to visit.   No current facility-administered medications on file prior to visit.     Objective:     Vitals:   09/23/16 1058  BP: 103/66  Pulse: 80              Physical examination   Pelvic:   Vulva: Normal appearance.  No lesions.  Vagina: No lesions or abnormalities noted.   Small amount of dark brown blood noted  Support: Normal pelvic support.  Urethra No masses tenderness or scarring.  Meatus Normal size without lesions or prolapse.  Cervix: Normal appearance.  No lesions. IUD strings noted at cervical os.  Anus: Normal exam.  No lesions.  Perineum: Normal exam.  No lesions.        Bimanual   Uterus: Normal size.  Non-tender.  Mobile.  AV.  Adnexae: No masses.  Non-tender to palpation.  Cul-de-sac: Negative for abnormality.     Assessment:    [id number] There are no active problems to display for this patient.    1. Surveillance of previously prescribed intrauterine contraceptive device   2. Breakthrough bleeding with IUD        Plan:            1.   Patient willing to wait 2 more weeks with the expectation that spotting will resolve.  If spotting continues consider 1 month of OCPs.    F/U  Return for Annual Physical.  Elonda Huskyavid J. Evans,  M.D. 09/23/2016 11:43 AM

## 2016-10-21 ENCOUNTER — Other Ambulatory Visit: Payer: Self-pay

## 2016-10-21 NOTE — Telephone Encounter (Signed)
Pt contacted- sample box of Lo Loestrin Fe given to pt with instructions to make appt to see provider if after finishing pack she still has bleeding.

## 2016-10-21 NOTE — Telephone Encounter (Signed)
Patient came into the office wanting to get a sample of Birth Control, Patient was told by Dr. Logan BoresEvans that it would be fine to stop by and pick up sample. Patient has had a IUD I"nserted and is currently still bleeding, Patient would like relief. Patient would like to be called to notify if she can come by the office to pick up, but Patient also left a preferred pharmacy if needed [facility] [address] Please advise.

## 2016-10-21 NOTE — Telephone Encounter (Signed)
Pt stopped by the office asking for OCPs. States she has been spotting since IUD insertion and was told  she could have OCPs. Please advise.

## 2016-10-29 ENCOUNTER — Encounter: Payer: Self-pay | Admitting: Obstetrics and Gynecology

## 2016-10-30 ENCOUNTER — Telehealth: Payer: Self-pay | Admitting: Obstetrics and Gynecology

## 2016-10-30 NOTE — Telephone Encounter (Signed)
Patient called to schedule an IUD removal appointment for asap - I have scheduled her for Monday 11/03/2016. She states that the IUD hasn't helped stop the bleeding as all.

## 2016-11-03 ENCOUNTER — Encounter: Payer: Self-pay | Admitting: Obstetrics and Gynecology

## 2016-11-03 ENCOUNTER — Ambulatory Visit (INDEPENDENT_AMBULATORY_CARE_PROVIDER_SITE_OTHER): Payer: BLUE CROSS/BLUE SHIELD | Admitting: Obstetrics and Gynecology

## 2016-11-03 VITALS — BP 101/69 | HR 75 | Ht 60.0 in | Wt 170.2 lb

## 2016-11-03 DIAGNOSIS — Z30432 Encounter for removal of intrauterine contraceptive device: Secondary | ICD-10-CM

## 2016-11-03 DIAGNOSIS — Z975 Presence of (intrauterine) contraceptive device: Secondary | ICD-10-CM | POA: Diagnosis not present

## 2016-11-03 DIAGNOSIS — N921 Excessive and frequent menstruation with irregular cycle: Secondary | ICD-10-CM | POA: Diagnosis not present

## 2016-11-03 NOTE — Progress Notes (Signed)
HPI:      [name] is a 26 y.o. [phone] who LMP was No LMP recorded. Patient is not currently having periods (Reason: IUD).  Subjective:   She presents today With complaint of daily bleeding on IUD. She is understandably not happy with this form of birth control. She states that she is not currently sexually active and does not desire another form of birth control. She is requesting IUD removal.    Hx: The following portions of the patient's history were reviewed and updated as appropriate:             She  has a past medical history of Anemia. She  does not have a problem list on file. She  has a past surgical history that includes Dilation and curettage of uterus. Her family history includes Asthma in her paternal grandmother; Diabetes in her paternal grandmother; Migraines in her paternal grandmother. She  reports that she has been smoking Cigarettes.  She has been smoking about 0.50 packs per day. She uses smokeless tobacco. She reports that she drinks alcohol. She reports that she does not use drugs. She is allergic to sulfa antibiotics and penicillins.       Review of Systems:  Review of Systems  Constitutional: Denied constitutional symptoms, night sweats, recent illness, fatigue, fever, insomnia and weight loss.  Eyes: Denied eye symptoms, eye pain, photophobia, vision change and visual disturbance.  Ears/Nose/Throat/Neck: Denied ear, nose, throat or neck symptoms, hearing loss, nasal discharge, sinus congestion and sore throat.  Cardiovascular: Denied cardiovascular symptoms, arrhythmia, chest pain/pressure, edema, exercise intolerance, orthopnea and palpitations.  Respiratory: Denied pulmonary symptoms, asthma, pleuritic pain, productive sputum, cough, dyspnea and wheezing.  Gastrointestinal: Denied, gastro-esophageal reflux, melena, nausea and vomiting.  Genitourinary: See HPI for additional information.  Musculoskeletal: Denied musculoskeletal symptoms,  stiffness, swelling, muscle weakness and myalgia.  Dermatologic: Denied dermatology symptoms, rash and scar.  Neurologic: Denied neurology symptoms, dizziness, headache, neck pain and syncope.  Psychiatric: Denied psychiatric symptoms, anxiety and depression.  Endocrine: Denied endocrine symptoms including hot flashes and night sweats.   Meds:   No current outpatient prescriptions on file prior to visit.   No current facility-administered medications on file prior to visit.     Objective:     Vitals:   11/03/16 1435  BP: 101/69  Pulse: 75              Physical examination   Pelvic:   Vulva: Normal appearance.  No lesions.  Vagina: No lesions or abnormalities noted.  Support: Normal pelvic support.  Urethra No masses tenderness or scarring.  Meatus Normal size without lesions or prolapse.  Cervix: Normal appearance.  No lesions. IUD strings noted at cervical os.  Anus: Normal exam.  No lesions.  Perineum: Normal exam.  No lesions.        Bimanual   Uterus: Normal size.  Non-tender.  Mobile.  AV.  Adnexae: No masses.  Non-tender to palpation.  Cul-de-sac: Negative for abnormality.   IUD Removal Strings of IUD identified and grasped.  IUD removed without problem.  Pt tolerated this well.  IUD noted to be intact.   Assessment:    [id number] There are no active problems to display for this patient.    1. Encounter for IUD removal   2. Breakthrough bleeding associated with intrauterine device (IUD)        Plan:            1.  Discussed other forms of birth  control. Patient desires she will contact us. Orders No orders of the defined types were placed in this encounter.   No orders of the defined types were placed in this encounter.       F/U  Return for Pt to contact us if symptoms worsen.  Elonda Husky, M.D. 11/03/2016 2:55 PM

## 2017-09-24 ENCOUNTER — Ambulatory Visit (INDEPENDENT_AMBULATORY_CARE_PROVIDER_SITE_OTHER): Payer: BLUE CROSS/BLUE SHIELD | Admitting: Obstetrics and Gynecology

## 2017-09-24 ENCOUNTER — Encounter: Payer: Self-pay | Admitting: Obstetrics and Gynecology

## 2017-09-24 VITALS — BP 99/66 | HR 90 | Ht 60.0 in | Wt 163.2 lb

## 2017-09-24 DIAGNOSIS — Z0000 Encounter for general adult medical examination without abnormal findings: Secondary | ICD-10-CM | POA: Diagnosis not present

## 2017-09-24 NOTE — Progress Notes (Signed)
HPI:      [name] is a 27 y.o. [phone] who LMP was Patient's last menstrual period was 08/25/2017 (approximate).  Subjective:   She presents today for her annual examination.  She has no complaints.  [facility] for a Pap smear which she reports is normal.  She states that she had BV at that time and was treated.  She denies any symptoms at this time.  She is not interested in birth control as her partner has a vasectomy.  She reports normal regular menstrual cycles.    Hx: The following portions of the patient's history were reviewed and updated as appropriate:             She  has a past medical history of Anemia. She does not have a problem list on file. She  has a past surgical history that includes Dilation and curettage of uterus. Her family history includes Asthma in her paternal grandmother; Diabetes in her paternal grandmother; Migraines in her paternal grandmother. She  reports that she has been smoking cigarettes.  She has been smoking about 0.50 packs per day. She uses smokeless tobacco. She reports that she drinks alcohol. She reports that she does not use drugs. She currently has no medications in their medication list. She is allergic to sulfa antibiotics and penicillins.       Review of Systems:  Review of Systems  Constitutional: Denied constitutional symptoms, night sweats, recent illness, fatigue, fever, insomnia and weight loss.  Eyes: Denied eye symptoms, eye pain, photophobia, vision change and visual disturbance.  Ears/Nose/Throat/Neck: Denied ear, nose, throat or neck symptoms, hearing loss, nasal discharge, sinus congestion and sore throat.  Cardiovascular: Denied cardiovascular symptoms, arrhythmia, chest pain/pressure, edema, exercise intolerance, orthopnea and palpitations.  Respiratory: Denied pulmonary symptoms, asthma, pleuritic pain, productive sputum, cough, dyspnea and wheezing.  Gastrointestinal: Denied,  gastro-esophageal reflux, melena, nausea and vomiting.  Genitourinary: Denied genitourinary symptoms including symptomatic vaginal discharge, pelvic relaxation issues, and urinary complaints.  Musculoskeletal: Denied musculoskeletal symptoms, stiffness, swelling, muscle weakness and myalgia.  Dermatologic: Denied dermatology symptoms, rash and scar.  Neurologic: Denied neurology symptoms, dizziness, headache, neck pain and syncope.  Psychiatric: Denied psychiatric symptoms, anxiety and depression.  Endocrine: Denied endocrine symptoms including hot flashes and night sweats.   Meds:   No current outpatient medications on file prior to visit.   No current facility-administered medications on file prior to visit.     Objective:     Vitals:   09/24/17 1031  BP: 99/66  Pulse: 90              Physical examination General NAD, Conversant  HEENT Atraumatic; Op clear with mmm.  Normo-cephalic. Pupils reactive. Anicteric sclerae  Thyroid/Neck Smooth without nodularity or enlargement. Normal ROM.  Neck Supple.  Skin No rashes, lesions or ulceration. Normal palpated skin turgor. No nodularity.  Breasts: No masses or discharge.  Symmetric.  No axillary adenopathy.  Lungs: Clear to auscultation.No rales or wheezes. Normal Respiratory effort, no retractions.  Heart: NSR.  No murmurs or rubs appreciated. No periferal edema  Abdomen: Soft.  Non-tender.  No masses.  No HSM. No hernia  Extremities: Moves all appropriately.  Normal ROM for age. No lymphadenopathy.  Neuro: Oriented to PPT.  Normal mood. Normal affect.     Pelvic:   Vulva: Normal appearance.  No lesions.  Vagina: No lesions or abnormalities noted.  Support: Normal pelvic support.  Urethra No masses  tenderness or scarring.  Meatus Normal size without lesions or prolapse.  Cervix: Normal appearance.  No lesions.  Anus: Normal exam.  No lesions.  Perineum: Normal exam.  No lesions.        Bimanual   Uterus: Normal size.   Non-tender.  Mobile.  AV.  Adnexae: No masses.  Non-tender to palpation.  Cul-de-sac: Negative for abnormality.      Assessment:    [id number] There are no active problems to display for this patient.    1. Encounter for annual physical exam     Patient without complaint or issue.  Exam normal.   Plan:            1.  Patient to contact [place] if her birth control needs change. Orders No orders of the defined types were placed in this encounter.   No orders of the defined types were placed in this encounter.       F/U  Return in about 1 year (around 09/25/2018) for Annual Physical.  Elonda Husky, M.D. 09/24/2017 11:19 AM

## 2018-06-11 ENCOUNTER — Emergency Department
Admission: EM | Admit: 2018-06-11 | Discharge: 2018-06-11 | Disposition: A | Discharge status: Home / Self Care | Payer: No Typology Code available for payment source | Attending: Emergency Medicine | Admitting: Emergency Medicine

## 2018-06-11 ENCOUNTER — Other Ambulatory Visit: Payer: Self-pay

## 2018-06-11 ENCOUNTER — Emergency Department: Payer: No Typology Code available for payment source

## 2018-06-11 DIAGNOSIS — Y999 Unspecified external cause status: Secondary | ICD-10-CM | POA: Insufficient documentation

## 2018-06-11 DIAGNOSIS — F17210 Nicotine dependence, cigarettes, uncomplicated: Secondary | ICD-10-CM | POA: Diagnosis not present

## 2018-06-11 DIAGNOSIS — S161XXA Strain of muscle, fascia and tendon at neck level, initial encounter: Secondary | ICD-10-CM | POA: Diagnosis not present

## 2018-06-11 DIAGNOSIS — Y939 Activity, unspecified: Secondary | ICD-10-CM | POA: Diagnosis not present

## 2018-06-11 DIAGNOSIS — S199XXA Unspecified injury of neck, initial encounter: Secondary | ICD-10-CM | POA: Diagnosis present

## 2018-06-11 DIAGNOSIS — Y9289 Other specified places as the place of occurrence of the external cause: Secondary | ICD-10-CM | POA: Insufficient documentation

## 2018-06-11 MED ORDER — IBUPROFEN 600 MG PO TABS: 600.0000 mg | ORAL_TABLET | Freq: Once | ORAL | Status: DC

## 2018-06-11 MED ORDER — IBUPROFEN 600 MG PO TABS: 600.0000 mg | ORAL_TABLET | Freq: Three times a day (TID) | ORAL | 0 refills | Status: DC | PRN

## 2018-06-11 MED ORDER — CYCLOBENZAPRINE HCL 10 MG PO TABS: 10.0000 mg | ORAL_TABLET | Freq: Once | ORAL | Status: DC

## 2018-06-11 MED ORDER — CYCLOBENZAPRINE HCL 10 MG PO TABS: 10.0000 mg | ORAL_TABLET | Freq: Three times a day (TID) | ORAL | 0 refills | Status: DC | PRN

## 2018-06-11 MED ORDER — TRAMADOL HCL 50 MG PO TABS: 50.0000 mg | ORAL_TABLET | Freq: Two times a day (BID) | ORAL | 0 refills | Status: DC | PRN
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
## 2018-06-16 ENCOUNTER — Encounter: Payer: Self-pay | Admitting: Emergency Medicine

## 2018-06-16 ENCOUNTER — Emergency Department
Admission: EM | Admit: 2018-06-16 | Discharge: 2018-06-16 | Disposition: A | Discharge status: Home / Self Care | Payer: No Typology Code available for payment source | Attending: Emergency Medicine | Admitting: Emergency Medicine

## 2018-06-16 ENCOUNTER — Emergency Department: Payer: No Typology Code available for payment source

## 2018-06-16 ENCOUNTER — Other Ambulatory Visit: Payer: Self-pay

## 2018-06-16 DIAGNOSIS — M545 Low back pain: Secondary | ICD-10-CM | POA: Diagnosis present

## 2018-06-16 DIAGNOSIS — M7918 Myalgia, other site: Secondary | ICD-10-CM | POA: Diagnosis not present

## 2018-06-16 DIAGNOSIS — F17210 Nicotine dependence, cigarettes, uncomplicated: Secondary | ICD-10-CM | POA: Insufficient documentation

## 2018-06-16 LAB — POCT PREGNANCY, URINE: Preg Test, Ur: NEGATIVE
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
## 2018-06-16 NOTE — Discharge Instructions (Addendum)
Follow-up with your primary care provider or can no clinic acute care if any continued problems.  Use ice or heat to your muscles as needed for discomfort.  Continue taking Flexeril, ibuprofen, and tramadol as needed for discomfort.  X-rays today were negative.

## 2019-01-25 ENCOUNTER — Other Ambulatory Visit: Payer: Self-pay

## 2019-01-25 ENCOUNTER — Emergency Department
Admission: EM | Admit: 2019-01-25 | Discharge: 2019-01-25 | Disposition: A | Discharge status: Home / Self Care | Payer: BC Managed Care – PPO | Attending: Emergency Medicine | Admitting: Emergency Medicine

## 2019-01-25 ENCOUNTER — Encounter: Payer: Self-pay | Admitting: Emergency Medicine

## 2019-01-25 DIAGNOSIS — Z20822 Contact with and (suspected) exposure to covid-19: Secondary | ICD-10-CM

## 2019-01-25 DIAGNOSIS — B349 Viral infection, unspecified: Secondary | ICD-10-CM | POA: Insufficient documentation

## 2019-01-25 DIAGNOSIS — F17210 Nicotine dependence, cigarettes, uncomplicated: Secondary | ICD-10-CM | POA: Insufficient documentation

## 2019-01-25 DIAGNOSIS — M791 Myalgia, unspecified site: Secondary | ICD-10-CM | POA: Diagnosis present

## 2019-01-25 DIAGNOSIS — L02411 Cutaneous abscess of right axilla: Secondary | ICD-10-CM | POA: Diagnosis not present

## 2019-01-25 DIAGNOSIS — R509 Fever, unspecified: Secondary | ICD-10-CM | POA: Insufficient documentation

## 2019-01-25 DIAGNOSIS — L02419 Cutaneous abscess of limb, unspecified: Secondary | ICD-10-CM

## 2019-01-25 LAB — POCT PREGNANCY, URINE: Preg Test, Ur: NEGATIVE

## 2019-01-25 MED ORDER — NAPROXEN 500 MG PO TABS: 500.0000 mg | ORAL_TABLET | Freq: Two times a day (BID) | ORAL | Status: DC

## 2019-01-25 MED ORDER — TRAMADOL HCL 50 MG PO TABS: 50.0000 mg | ORAL_TABLET | Freq: Four times a day (QID) | ORAL | 0 refills | Status: DC | PRN

## 2019-01-25 MED ORDER — DOXYCYCLINE MONOHYDRATE 100 MG PO CAPS: 100.0000 mg | ORAL_CAPSULE | Freq: Two times a day (BID) | ORAL | 0 refills | Status: DC

## 2019-01-25 NOTE — ED Triage Notes (Signed)
Also says she has a knot under arm she wants checked.

## 2019-01-25 NOTE — ED Notes (Signed)
See triage note  States she has body aches and subjective fever since yesterday  Also thinks she may have an abscess under her right arm  Denies any n/v/d/ ,cough or sore throat

## 2019-01-25 NOTE — Discharge Instructions (Signed)
Advised to have COVID-19 testing done at the outpatient clinic at this hospital.  Advise of quarantine pending results of COVID-19 test.

## 2019-01-25 NOTE — ED Triage Notes (Signed)
Fever, body aches since yestrday.

## 2019-01-25 NOTE — ED Provider Notes (Signed)
[facility] Emergency Department Provider Note   ____________________________________________   First MD Initiated Contact with Patient 01/25/19 1048     (approximate)  I have reviewed the triage vital signs and the nursing notes.   HISTORY  Chief Complaint Generalized Body Aches and Fever    HPI Mitchell Virginia is a 28 y.o. female patient presents with body aches/fatigue for 2 days.  Patient also states she has a "knot" on her right arm which she would like to have evaluated.  Patient states the knot is tender to palpation.  Patient denies drainage.  Patient rates the pain in the right axillary area is 8/10.  Patient described the pain as "sore".  No palliative measure for both complaints.         Past Medical History:  Diagnosis Date  . Anemia     There are no active problems to display for this patient.   Past Surgical History:  Procedure Laterality Date  . DILATION AND CURETTAGE OF UTERUS      Prior to Admission medications   Medication Sig Start Date End Date Taking? Authorizing Provider  doxycycline (MONODOX) 100 MG capsule Take 1 capsule (100 mg total) by mouth 2 (two) times daily. 01/25/19   Sable Feil, PA-C  naproxen (NAPROSYN) 500 MG tablet Take 1 tablet (500 mg total) by mouth 2 (two) times daily with a meal. 01/25/19   Sable Feil, PA-C  traMADol (ULTRAM) 50 MG tablet Take 1 tablet (50 mg total) by mouth every 6 (six) hours as needed. 01/25/19 01/25/20  Sable Feil, PA-C    Allergies Sulfa antibiotics and Penicillins  Family History  Problem Relation Age of Onset  . Diabetes Paternal Grandmother   . Asthma Paternal Grandmother   . Migraines Paternal Grandmother   . Breast cancer Neg Hx   . Ovarian cancer Neg Hx   . Colon cancer Neg Hx     Social History Social History   Tobacco Use  . Smoking status: Current Every Day Smoker    Packs/day: 0.50    Types: Cigarettes  . Smokeless tobacco: Current User   Substance Use Topics  . Alcohol use: Yes    Comment: occas.   . Drug use: No    Review of Systems Constitutional: No fever/chills.  Fatigue and body aches. Eyes: No visual changes. ENT: No sore throat. Cardiovascular: Denies chest pain. Respiratory: Denies shortness of breath. Gastrointestinal: No abdominal pain.  No nausea, no vomiting.  No diarrhea.  No constipation. Genitourinary: Negative for dysuria. Musculoskeletal: Negative for back pain. Skin: Nodule lesion right axillary area.   Neurological: Negative for headaches, focal weakness or numbness. Allergic/Immunilogical: Penicillin and sulfa.  ____________________________________________   PHYSICAL EXAM:  VITAL SIGNS: ED Triage Vitals  Enc Vitals Group     BP 01/25/19 1038 111/65     Pulse Rate 01/25/19 1038 (!) 102     Resp 01/25/19 1038 18     Temp 01/25/19 1038 98.6 F (37 C)     Temp Source 01/25/19 1038 Oral     SpO2 01/25/19 1038 99 %     Weight 01/25/19 1040 175 lb (79.4 kg)     Height 01/25/19 1040 5' (1.524 m)     Head Circumference --      Peak Flow --      Pain Score 01/25/19 1038 8     Pain Loc --      Pain Edu? --      Excl.  in GC? --    Constitutional: Alert and oriented. Well appearing and in no acute distress. Neck: No stridor.  No cervical spine tenderness to palpation. Hematological/Lymphatic/Immunilogical: No cervical lymphadenopathy. Cardiovascular: Normal rate, regular rhythm. Grossly normal heart sounds.  Good peripheral circulation. Respiratory: Normal respiratory effort.  No retractions. Lungs CTAB. Neurologic:  Normal speech and language. No gross focal neurologic deficits are appreciated. No gait instability. Skin:  Skin is warm, dry and intact.  Nonfluctuant nodule lesion right axillary area. Psychiatric: Mood and affect are normal. Speech and behavior are normal.  ____________________________________________   LABS (all labs ordered are listed, but only abnormal results are  displayed)  Labs Reviewed  POC URINE PREG, ED  POCT PREGNANCY, URINE   ____________________________________________  EKG   ____________________________________________  RADIOLOGY  ED MD interpretation:    Official radiology report(s): No results found.  ____________________________________________   PROCEDURES  Procedure(s) performed (including Critical Care):  Procedures   ____________________________________________   INITIAL IMPRESSION / ASSESSMENT AND PLAN / ED COURSE  As part of my medical decision making, I reviewed the following data within the electronic MEDICAL RECORD NUMBER         Virginia Mitchell was evaluated in Emergency Department on 01/25/2019 for the symptoms described in the history of present illness. She was evaluated in the context of the global COVID-19 pandemic, which necessitated consideration that the patient might be at risk for infection with the SARS-CoV-2 virus that causes COVID-19. Institutional protocols and algorithms that pertain to the evaluation of patients at risk for COVID-19 are in a state of rapid change based on information released by regulatory bodies including the CDC and federal and state organizations. These policies and algorithms were followed during the patient's care in the ED.   Patient presents with nodule lesion right axillary area cyst with abscess.  Area is nonfluctuant.  Discussed rationale for not incised and drained at this time.  Patient also presents with physical findings consistent with viral illness.  Patient advised to have a COVID-19 test done at the outpatient clinic.  Patient advised self quarantine pending results of COVID-19 test.     ____________________________________________   FINAL CLINICAL IMPRESSION(S) / ED DIAGNOSES  Final diagnoses:  Abscess of axilla  Viral syndrome     ED Discharge Orders         Ordered    doxycycline (MONODOX) 100 MG capsule  2 times daily     01/25/19 1116     naproxen (NAPROSYN) 500 MG tablet  2 times daily with meals     01/25/19 1116    traMADol (ULTRAM) 50 MG tablet  Every 6 hours PRN     01/25/19 1116           Note:  This document was prepared using Dragon voice recognition software and may include unintentional dictation errors.    Joni Reining, PA-C 01/25/19 1122    Phineas Semen, MD 01/25/19 [phone]

## 2019-01-26 LAB — NOVEL CORONAVIRUS, NAA: SARS-CoV-2, NAA: NOT DETECTED

## 2019-03-04 ENCOUNTER — Emergency Department: Payer: BC Managed Care – PPO

## 2019-03-04 ENCOUNTER — Other Ambulatory Visit: Payer: Self-pay

## 2019-03-04 ENCOUNTER — Emergency Department
Admission: EM | Admit: 2019-03-04 | Discharge: 2019-03-04 | Disposition: A | Discharge status: Home / Self Care | Payer: BC Managed Care – PPO | Attending: Emergency Medicine | Admitting: Emergency Medicine

## 2019-03-04 ENCOUNTER — Encounter: Payer: Self-pay | Admitting: Emergency Medicine

## 2019-03-04 DIAGNOSIS — Z79899 Other long term (current) drug therapy: Secondary | ICD-10-CM | POA: Insufficient documentation

## 2019-03-04 DIAGNOSIS — O26899 Other specified pregnancy related conditions, unspecified trimester: Secondary | ICD-10-CM

## 2019-03-04 DIAGNOSIS — F17210 Nicotine dependence, cigarettes, uncomplicated: Secondary | ICD-10-CM | POA: Insufficient documentation

## 2019-03-04 DIAGNOSIS — O99891 Other specified diseases and conditions complicating pregnancy: Secondary | ICD-10-CM | POA: Insufficient documentation

## 2019-03-04 DIAGNOSIS — O99331 Smoking (tobacco) complicating pregnancy, first trimester: Secondary | ICD-10-CM | POA: Diagnosis not present

## 2019-03-04 DIAGNOSIS — R1032 Left lower quadrant pain: Secondary | ICD-10-CM | POA: Insufficient documentation

## 2019-03-04 DIAGNOSIS — Z3A01 Less than 8 weeks gestation of pregnancy: Secondary | ICD-10-CM | POA: Diagnosis not present

## 2019-03-04 LAB — COMPREHENSIVE METABOLIC PANEL
ALT: 14 U/L (ref 0–44)
AST: 16 U/L (ref 15–41)
Albumin: 3.8 g/dL (ref 3.5–5.0)
Alkaline Phosphatase: 53 U/L (ref 38–126)
Anion gap: 10 (ref 5–15)
BUN: 10 mg/dL (ref 6–20)
CO2: 23 mmol/L (ref 22–32)
Calcium: 9.6 mg/dL (ref 8.9–10.3)
Chloride: 103 mmol/L (ref 98–111)
Creatinine, Ser: 0.54 mg/dL (ref 0.44–1.00)
GFR calc Af Amer: >60 mL/min (ref >60)
GFR calc non Af Amer: >60 mL/min (ref >60)
Glucose, Bld: 89 mg/dL (ref 70–99)
Potassium: 3.9 mmol/L (ref 3.5–5.1)
Sodium: 136 mmol/L (ref 135–145)
Total Bilirubin: 0.3 mg/dL (ref 0.3–1.2)
Total Protein: 6.9 g/dL (ref 6.5–8.1)

## 2019-03-04 LAB — ABO/RH: ABO/RH(D): O POS

## 2019-03-04 LAB — CBC WITH DIFFERENTIAL/PLATELET
Abs Immature Granulocytes: 0.07 10*3/uL (ref 0.00–0.07)
Basophils Absolute: 0.1 10*3/uL (ref 0.0–0.1)
Basophils Relative: 1 %
Eosinophils Absolute: 0.4 10*3/uL (ref 0.0–0.5)
Eosinophils Relative: 3 %
HCT: 36.3 % (ref 36.0–46.0)
Hemoglobin: 12.2 g/dL (ref 12.0–15.0)
Immature Granulocytes: 1 %
Lymphocytes Relative: 34 %
Lymphs Abs: 5.3 10*3/uL — ABNORMAL HIGH (ref 0.7–4.0)
MCH: 30.8 pg (ref 26.0–34.0)
MCHC: 33.6 g/dL (ref 30.0–36.0)
MCV: 91.7 fL (ref 80.0–100.0)
Monocytes Absolute: 1.2 10*3/uL — ABNORMAL HIGH (ref 0.1–1.0)
Monocytes Relative: 8 %
Neutro Abs: 8.5 10*3/uL — ABNORMAL HIGH (ref 1.7–7.7)
Neutrophils Relative %: 53 %
Platelets: 401 10*3/uL — ABNORMAL HIGH (ref 150–400)
RBC: 3.96 MIL/uL (ref 3.87–5.11)
RDW: 13.3 % (ref 11.5–15.5)
WBC: 15.6 10*3/uL — ABNORMAL HIGH (ref 4.0–10.5)
nRBC: 0 % (ref 0.0–0.2)

## 2019-03-04 LAB — WET PREP, GENITAL
Clue Cells Wet Prep HPF POC: NONE SEEN
Sperm: NONE SEEN
Trich, Wet Prep: NONE SEEN
Yeast Wet Prep HPF POC: NONE SEEN

## 2019-03-04 LAB — URINALYSIS, COMPLETE (UACMP) WITH MICROSCOPIC
Bacteria, UA: NONE SEEN
Bilirubin Urine: NEGATIVE
Glucose, UA: NEGATIVE mg/dL
Hgb urine dipstick: NEGATIVE
Ketones, ur: NEGATIVE mg/dL
Leukocytes,Ua: NEGATIVE
Nitrite: NEGATIVE
Protein, ur: NEGATIVE mg/dL
Specific Gravity, Urine: 1.016 (ref 1.005–1.030)
pH: 6 (ref 5.0–8.0)

## 2019-03-04 LAB — LIPASE, BLOOD: Lipase: 28 U/L (ref 11–51)

## 2019-03-04 LAB — OB RESULTS CONSOLE GC/CHLAMYDIA: Gonorrhea: NEGATIVE

## 2019-03-04 LAB — POCT PREGNANCY, URINE: Preg Test, Ur: POSITIVE — AB

## 2019-03-04 LAB — OB RESULTS CONSOLE HEPATITIS B SURFACE ANTIGEN: Hepatitis B Surface Ag: NEGATIVE

## 2019-03-04 LAB — HCG, QUANTITATIVE, PREGNANCY: hCG, Beta Chain, Quant, S: 42208 m[IU]/mL — ABNORMAL HIGH (ref <5)

## 2019-03-04 NOTE — ED Triage Notes (Signed)
Patient ambulatory to triage with steady gait, without difficulty or distress noted, mask in place; pt reports intermittent left lower abd pain with no accomp symptoms; st +pregnancy test; G3P1

## 2019-03-04 NOTE — ED Provider Notes (Signed)
[facility] Emergency Department Provider Note  ____________________________________________  Time seen: Approximately 2:46 AM  I have reviewed the triage vital signs and the nursing notes.   HISTORY  Chief Complaint Abdominal Pain   HPI Virginia Mitchell is a 28 y.o. female the history of anemia who presents for evaluation of abdominal pain.  Patient reports a positive pregnancy test 2 weeks ago.   LMP 7 weeks ago.  Since testing positive for pregnancy patient has had intermittent left lower quadrant abdominal pain that she describes as a sharp pain the last several seconds and resolves without intervention.  She has established care for this pregnancy yet.  She has had nausea for the last several weeks which is worse in the morning but no vomiting.  She also has had vaginal discharge for the last several days.  No fever, no chills, no diarrhea, no constipation, no dysuria or hematuria, no vaginal bleeding.  She has no pain at this time.  This is patient's third pregnancy.  She has a child at home and a miscarriage.  Past Medical History:  Diagnosis Date   Anemia     Past Surgical History:  Procedure Laterality Date   DILATION AND CURETTAGE OF UTERUS      Prior to Admission medications   Medication Sig Start Date End Date Taking? Authorizing Provider  doxycycline (MONODOX) 100 MG capsule Take 1 capsule (100 mg total) by mouth 2 (two) times daily. 01/25/19   Sable Feil, PA-C  naproxen (NAPROSYN) 500 MG tablet Take 1 tablet (500 mg total) by mouth 2 (two) times daily with a meal. 01/25/19   Sable Feil, PA-C  traMADol (ULTRAM) 50 MG tablet Take 1 tablet (50 mg total) by mouth every 6 (six) hours as needed. 01/25/19 01/25/20  Sable Feil, PA-C    Allergies Sulfa antibiotics and Penicillins  Family History  Problem Relation Age of Onset   Diabetes Paternal Grandmother    Asthma Paternal Grandmother    Migraines Paternal Grandmother     Breast cancer Neg Hx    Ovarian cancer Neg Hx    Colon cancer Neg Hx     Social History Social History   Tobacco Use   Smoking status: Current Every Day Smoker    Packs/day: 0.50    Types: Cigarettes   Smokeless tobacco: Current User  Substance Use Topics   Alcohol use: Yes    Comment: occas.    Drug use: No    Review of Systems  Constitutional: Negative for fever. Eyes: Negative for visual changes. ENT: Negative for sore throat. Neck: No neck pain  Cardiovascular: Negative for chest pain. Respiratory: Negative for shortness of breath. Gastrointestinal: + abdominal pain and nausea. No vomiting or diarrhea. Genitourinary: Negative for dysuria. Musculoskeletal: Negative for back pain. Skin: Negative for rash. Neurological: Negative for headaches, weakness or numbness. Psych: No SI or HI  ____________________________________________   PHYSICAL EXAM:  VITAL SIGNS: Vitals:   03/04/19 0250  BP: 97/65  Pulse: 91  Resp: 16  Temp: 98.4 F (36.9 C)  SpO2: 99%     Constitutional: Alert and oriented. Well appearing and in no apparent distress. HEENT:      Head: Normocephalic and atraumatic.         Eyes: Conjunctivae are normal. Sclera is non-icteric.       Mouth/Throat: Mucous membranes are moist.       Neck: Supple with no signs of meningismus. Cardiovascular: Regular rate and rhythm. No murmurs,  gallops, or rubs. 2+ symmetrical distal pulses are present in all extremities. No JVD. Respiratory: Normal respiratory effort. Lungs are clear to auscultation bilaterally. No wheezes, crackles, or rhonchi.  Gastrointestinal: Soft, non tender, and non distended with positive bowel sounds. No rebound or guarding. Pelvic exam: Normal external genitalia, no rashes or lesions. Normal cervical mucus. Os closed. No cervical motion tenderness.  No uterine or adnexal tenderness.   Musculoskeletal: Nontender with normal range of motion in all extremities. No edema, cyanosis, or  erythema of extremities. Neurologic: Normal speech and language. Face is symmetric. Moving all extremities. No gross focal neurologic deficits are appreciated. Skin: Skin is warm, dry and intact. No rash noted. Psychiatric: Mood and affect are normal. Speech and behavior are normal.  ____________________________________________   LABS (all labs ordered are listed, but only abnormal results are displayed)  Labs Reviewed  WET PREP, GENITAL - Abnormal; Notable for the following components:      Result Value   WBC, Wet Prep HPF POC FEW (*)    All other components within normal limits  CBC WITH DIFFERENTIAL/PLATELET - Abnormal; Notable for the following components:   WBC 15.6 (*)    Platelets 401 (*)    Neutro Abs 8.5 (*)    Lymphs Abs 5.3 (*)    Monocytes Absolute 1.2 (*)    All other components within normal limits  URINALYSIS, COMPLETE (UACMP) WITH MICROSCOPIC - Abnormal; Notable for the following components:   Color, Urine YELLOW (*)    APPearance CLEAR (*)    All other components within normal limits  HCG, QUANTITATIVE, PREGNANCY - Abnormal; Notable for the following components:   hCG, Beta Chain, Quant, S 42,208 (*)    All other components within normal limits  POCT PREGNANCY, URINE - Abnormal; Notable for the following components:   Preg Test, Ur POSITIVE (*)    All other components within normal limits  GC/CHLAMYDIA PROBE AMP  COMPREHENSIVE METABOLIC PANEL  LIPASE, BLOOD  ABO/RH   ____________________________________________  EKG  none  ____________________________________________  RADIOLOGY  I have personally reviewed the images performed during this visit and I agree with the Radiologist's read.   Interpretation by Radiologist:  [place] Ob Less Than 14 Weeks With Ob Transvaginal  Result Date: 03/04/2019 CLINICAL DATA:  [age] year old pregnant female with left lower quadrant abdominal pain. LMP: 01/07/2019 corresponding to an estimated gestational age of [redacted] weeks, 0  days. EXAM: OBSTETRIC <14 WK US AND TRANSVAGINAL OB US TECHNIQUE: Both transabdominal and transvaginal ultrasound examinations were performed for complete evaluation of the gestation as well as the maternal uterus, adnexal regions, and pelvic cul-de-sac. Transvaginal technique was performed to assess early pregnancy. COMPARISON:  None. FINDINGS: Intrauterine gestational sac: Single intrauterine gestational sac. Yolk sac:  Seen Embryo:  Present Cardiac Activity: Detected Heart Rate: 171 bpm CRL:  14 mm   7 w   5 d                  US EDC: 10/16/2019 Subchorionic hemorrhage:  None visualized. Maternal uterus/adnexae: The maternal ovaries are unremarkable. There is a 2 cm corpus luteum in the right ovary. IMPRESSION: Single live intrauterine pregnancy with an estimated gestational age of [redacted] weeks, 5 days based on today's crown-rump length. Electronically Signed   By: Elgie CollardArash  Radparvar M.D.   On: 03/04/2019 01:40     ____________________________________________   PROCEDURES  Procedure(s) performed: None Procedures Critical Care performed:  None ____________________________________________   INITIAL IMPRESSION / ASSESSMENT AND PLAN / ED COURSE  28 y.o. female the history of anemia who presents for evaluation of abdominal pain and + pregnancy test and vaginal discharge.  Patient is extremely well-appearing in no distress with normal vital signs, abdomen is soft with no tenderness throughout, normal pelvic exam with no CMT or adnexal tenderness.  GC and Chlamydia are pending.  Wet prep negative.  Transvaginal ultrasound confirms 7-week 5-day IUP with no complications, normal ovaries otherwise.  Pain has been intermittent for the last 2 weeks.  Possibly round ligament pain.  She has no pain at this time or any tenderness.  Labs showed leukocytosis of 15.6 which could be due to pregnancy.  Patient has no other signs of infection based on history and physical exam.  Discussed prenatal care, prenatal vitamins,  and follow-up with OB/GYN.  Discussed my standard return precautions.       As part of my medical decision making, I reviewed the following data within the electronic MEDICAL RECORD NUMBER Nursing notes reviewed and incorporated, Labs reviewed , Old chart reviewed, Radiograph reviewed , Notes from prior ED visits and [place] Controlled Substance Database   Patient was evaluated in Emergency Department today for the symptoms described in the history of present illness. Patient was evaluated in the context of the global COVID-19 pandemic, which necessitated consideration that the patient might be at risk for infection with the SARS-CoV-2 virus that causes COVID-19. Institutional protocols and algorithms that pertain to the evaluation of patients at risk for COVID-19 are in a state of rapid change based on information released by regulatory bodies including the CDC and federal and state organizations. These policies and algorithms were followed during the patient's care in the ED.   ____________________________________________   FINAL CLINICAL IMPRESSION(S) / ED DIAGNOSES   Final diagnoses:  Abdominal pain affecting pregnancy      NEW MEDICATIONS STARTED DURING THIS VISIT:  ED Discharge Orders    None       Note:  This document was prepared using Dragon voice recognition software and may include unintentional dictation errors.    Nita Sickle, MD 03/04/19 [phone]

## 2019-03-07 LAB — GC/CHLAMYDIA PROBE AMP
Chlamydia trachomatis, NAA: NEGATIVE
Neisseria Gonorrhoeae by PCR: NEGATIVE

## 2019-03-28 DIAGNOSIS — O219 Vomiting of pregnancy, unspecified: Secondary | ICD-10-CM | POA: Diagnosis not present

## 2019-03-28 DIAGNOSIS — R1032 Left lower quadrant pain: Secondary | ICD-10-CM | POA: Diagnosis not present

## 2019-03-28 DIAGNOSIS — Z3491 Encounter for supervision of normal pregnancy, unspecified, first trimester: Secondary | ICD-10-CM | POA: Diagnosis not present

## 2019-03-28 DIAGNOSIS — Z114 Encounter for screening for human immunodeficiency virus [HIV]: Secondary | ICD-10-CM | POA: Diagnosis not present

## 2019-04-27 ENCOUNTER — Emergency Department
Admission: EM | Admit: 2019-04-27 | Discharge: 2019-04-27 | Disposition: A | Discharge status: Home / Self Care | Payer: Medicaid Other | Attending: Emergency Medicine | Admitting: Emergency Medicine

## 2019-04-27 ENCOUNTER — Other Ambulatory Visit: Payer: Self-pay

## 2019-04-27 ENCOUNTER — Encounter: Payer: Self-pay | Admitting: Emergency Medicine

## 2019-04-27 DIAGNOSIS — Z3A00 Weeks of gestation of pregnancy not specified: Secondary | ICD-10-CM | POA: Diagnosis not present

## 2019-04-27 DIAGNOSIS — O26892 Other specified pregnancy related conditions, second trimester: Secondary | ICD-10-CM | POA: Diagnosis not present

## 2019-04-27 DIAGNOSIS — F17220 Nicotine dependence, chewing tobacco, uncomplicated: Secondary | ICD-10-CM | POA: Diagnosis not present

## 2019-04-27 DIAGNOSIS — Z3492 Encounter for supervision of normal pregnancy, unspecified, second trimester: Secondary | ICD-10-CM | POA: Diagnosis not present

## 2019-04-27 DIAGNOSIS — R42 Dizziness and giddiness: Secondary | ICD-10-CM | POA: Diagnosis not present

## 2019-04-27 DIAGNOSIS — E876 Hypokalemia: Secondary | ICD-10-CM | POA: Diagnosis not present

## 2019-04-27 DIAGNOSIS — O99332 Smoking (tobacco) complicating pregnancy, second trimester: Secondary | ICD-10-CM | POA: Insufficient documentation

## 2019-04-27 DIAGNOSIS — O99282 Endocrine, nutritional and metabolic diseases complicating pregnancy, second trimester: Secondary | ICD-10-CM | POA: Diagnosis not present

## 2019-04-27 DIAGNOSIS — F17210 Nicotine dependence, cigarettes, uncomplicated: Secondary | ICD-10-CM | POA: Diagnosis not present

## 2019-04-27 DIAGNOSIS — O99892 Other specified diseases and conditions complicating childbirth: Secondary | ICD-10-CM | POA: Diagnosis not present

## 2019-04-27 LAB — URINALYSIS, COMPLETE (UACMP) WITH MICROSCOPIC
Bilirubin Urine: NEGATIVE
Glucose, UA: NEGATIVE mg/dL
Hgb urine dipstick: NEGATIVE
Ketones, ur: 5 mg/dL — AB
Nitrite: NEGATIVE
Protein, ur: NEGATIVE mg/dL
Specific Gravity, Urine: 1.018 (ref 1.005–1.030)
pH: 6 (ref 5.0–8.0)

## 2019-04-27 LAB — CBC
HCT: 35.3 % — ABNORMAL LOW (ref 36.0–46.0)
Hemoglobin: 12 g/dL (ref 12.0–15.0)
MCH: 31.3 pg (ref 26.0–34.0)
MCHC: 34 g/dL (ref 30.0–36.0)
MCV: 91.9 fL (ref 80.0–100.0)
Platelets: 384 10*3/uL (ref 150–400)
RBC: 3.84 MIL/uL — ABNORMAL LOW (ref 3.87–5.11)
RDW: 13.3 % (ref 11.5–15.5)
WBC: 14.6 10*3/uL — ABNORMAL HIGH (ref 4.0–10.5)
nRBC: 0 % (ref 0.0–0.2)

## 2019-04-27 LAB — BASIC METABOLIC PANEL
Anion gap: 8 (ref 5–15)
BUN: 5 mg/dL — ABNORMAL LOW (ref 6–20)
CO2: 21 mmol/L — ABNORMAL LOW (ref 22–32)
Calcium: 8.8 mg/dL — ABNORMAL LOW (ref 8.9–10.3)
Chloride: 107 mmol/L (ref 98–111)
Creatinine, Ser: 0.34 mg/dL — ABNORMAL LOW (ref 0.44–1.00)
GFR calc Af Amer: >60 mL/min (ref >60)
GFR calc non Af Amer: >60 mL/min (ref >60)
Glucose, Bld: 90 mg/dL (ref 70–99)
Potassium: 3.3 mmol/L — ABNORMAL LOW (ref 3.5–5.1)
Sodium: 136 mmol/L (ref 135–145)

## 2019-04-27 NOTE — Discharge Instructions (Signed)
Follow-up with your OB/GYN if any continued problems.  Begin eating foods that are rich in potassium which will help your low potassium.  Your potassium level is only 2/10 of a point below normal and this should help greatly.  Return to the emergency department if any severe worsening of your symptoms or urgent concerns over the holiday weekend.

## 2019-04-27 NOTE — ED Provider Notes (Signed)
[facility] Emergency Department Provider Note  ____________________________________________   First MD Initiated Contact with Patient 04/27/19 1234     (approximate)  I have reviewed the triage vital signs and the nursing notes.   HISTORY  Chief Complaint Dizziness   HPI Virginia Mitchell is a 28 y.o. female presents to the ED with complaint of intermittent dizziness.  Patient states that she was seen in the emergency department and does not know how "far along she is with her pregnancy".  Records indicate that on her visit October 30 that she was 7 weeks and 5 days.  Patient denies any vaginal discharge or bleeding.  She states she is worried since she got dizzy if "the baby is okay".  She denies any nausea or vomiting.  She states that she is eating regular meals and drinking fluids as normal.     Past Medical History:  Diagnosis Date  . Anemia     There are no problems to display for this patient.   Past Surgical History:  Procedure Laterality Date  . DILATION AND CURETTAGE OF UTERUS      Prior to Admission medications   Not on File    Allergies Sulfa antibiotics and Penicillins  Family History  Problem Relation Age of Onset  . Diabetes Paternal Grandmother   . Asthma Paternal Grandmother   . Migraines Paternal Grandmother   . Breast cancer Neg Hx   . Ovarian cancer Neg Hx   . Colon cancer Neg Hx     Social History Social History   Tobacco Use  . Smoking status: Current Every Day Smoker    Packs/day: 0.50    Types: Cigarettes  . Smokeless tobacco: Current User  Substance Use Topics  . Alcohol use: Yes    Comment: occas.   . Drug use: No    Review of Systems Constitutional: No fever/chills, intermittent dizziness. Eyes: No visual changes. ENT: No sore throat. Cardiovascular: Denies chest pain. Respiratory: Denies shortness of breath. Gastrointestinal: No abdominal pain.  No nausea, no vomiting.  No diarrhea.     Genitourinary: Negative for dysuria.  No vaginal discharge or spotting. Musculoskeletal: Negative for back pain. Skin: Negative for rash. Neurological: Negative for headaches, focal weakness or numbness.   ____________________________________________   PHYSICAL EXAM:  VITAL SIGNS: ED Triage Vitals  Enc Vitals Group     BP 04/27/19 1207 126/89     Pulse Rate 04/27/19 1207 91     Resp 04/27/19 1207 20     Temp 04/27/19 1207 98.9 F (37.2 C)     Temp Source 04/27/19 1207 Oral     SpO2 04/27/19 1207 100 %     Weight 04/27/19 1152 175 lb (79.4 kg)     Height 04/27/19 1152 5' (1.524 m)     Head Circumference --      Peak Flow --      Pain Score 04/27/19 1152 0     Pain Loc --      Pain Edu? --      Excl. in [place]? --    Constitutional: Alert and oriented. Well appearing and in no acute distress. Eyes: Conjunctivae are normal. PERRL. EOMI. no nystagmus. Head: Atraumatic. Neck: No stridor.   Cardiovascular: Normal rate, regular rhythm. Grossly normal heart sounds.  Good peripheral circulation. Respiratory: Normal respiratory effort.  No retractions. Lungs CTAB. Gastrointestinal: Soft and nontender. No distention.  Bowel sounds normoactive x4 quadrants. Musculoskeletal: His upper and lower extremities with any difficulty  normal gait was noted. Neurologic:  Normal speech and language. No gross focal neurologic deficits are appreciated. No gait instability. Skin:  Skin is warm, dry and intact. No rash noted. Psychiatric: Mood and affect are normal. Speech and behavior are normal.  ____________________________________________   LABS (all labs ordered are listed, but only abnormal results are displayed)  Labs Reviewed  BASIC METABOLIC PANEL - Abnormal; Notable for the following components:      Result Value   Potassium 3.3 (*)    CO2 21 (*)    BUN 5 (*)    Creatinine, Ser 0.34 (*)    Calcium 8.8 (*)    All other components within normal limits  CBC - Abnormal; Notable for  the following components:   WBC 14.6 (*)    RBC 3.84 (*)    HCT 35.3 (*)    All other components within normal limits  URINALYSIS, COMPLETE (UACMP) WITH MICROSCOPIC - Abnormal; Notable for the following components:   Color, Urine YELLOW (*)    APPearance HAZY (*)    Ketones, ur 5 (*)    Leukocytes,Ua TRACE (*)    Bacteria, UA RARE (*)    All other components within normal limits  CBG MONITORING, ED     PROCEDURES  Procedure(s) performed (including Critical Care):  Procedures   ____________________________________________   INITIAL IMPRESSION / ASSESSMENT AND PLAN / ED COURSE  As part of my medical decision making, I reviewed the following data within the electronic MEDICAL RECORD NUMBER Notes from prior ED visits and [place] Controlled Substance Database  28 year old female presents to the ED with intermittent complaint of dizziness which is caused her to worry about her pregnancy.  She denies any abdominal pain, vaginal discharge or bleeding.  She states that she is not sure how far along she is however it was documented that during her visit in October that she was 7 weeks and 5 days.  Fetal heart tones were reassuring and patient was actually checking the fetal heart tones when the nurse entered the room.  Patient was made aware that she was slightly hypokalemic and told that she should increase diet containing potassium.  Patient is also encouraged to make an appointment with her OB/GYN for continued prenatal care.  Patient was encouraged to return to the emergency department if any severe worsening of her symptoms or urgent concerns.  ____________________________________________   FINAL CLINICAL IMPRESSION(S) / ED DIAGNOSES  Final diagnoses:  Dizziness  Hypokalemia  Second trimester pregnancy     ED Discharge Orders    None       Note:  This document was prepared using Dragon voice recognition software and may include unintentional dictation errors.    Tommi Rumps, PA-C 04/27/19 1601    Jene Every, MD 05/02/19 1057

## 2019-04-27 NOTE — ED Notes (Signed)
See triage note  Presents with some intermittent nausea with dizziness  States she recently found out she was pregnant  Unsure of how far  Denies any vaginal bleeding   No fever

## 2019-04-27 NOTE — ED Triage Notes (Signed)
Pt reports is pregnant and has been getting dizzy intermittently. Pt unsure of how far along she is, states she found out here and has not been able to make an appt yet. Denies pain.

## 2019-05-06 NOTE — L&D Delivery Note (Signed)
Delivery Note  First Stage: Labor onset: 1700 on 6/10 Augmentation: Pitocin Analgesia /Anesthesia intrapartum: epidural SROM at 0440, thin meconium  Second Stage: Complete dilation at 0607  Onset of pushing at 0610 FHR second stage Cat II tracing, variable decels with UCs  Delivery of a viable female infant on 10/14/19 at 0615 by CNM delivery of fetal head in LOA position with restitution to LOT, compound presentation with posterior left hand at chin. Delivered posterior arm first due to compound hand, then anterior shoulder delivered easily. No nuchal cord; Baby placed on mom's chest, and attended to by peds.  Cord double clamped after cessation of pulsation, cut by FOB Cord blood sample collected   Third Stage: Placenta delivered spontaneously intact with [facility] @ [phone] Placenta disposition: firm Uterine tone firm / bleeding scant  No cervical, vaginal or perineal lacerations identified  Anesthesia for repair: n/a Est. Blood Loss (mL):  Complications: none  Mom to postpartum.  Baby to Couplet care / Skin to Skin.  Newborn: Birth Weight: pending  Apgar Scores: 8/9 Feeding planned: breast

## 2019-05-10 DIAGNOSIS — Z124 Encounter for screening for malignant neoplasm of cervix: Secondary | ICD-10-CM | POA: Diagnosis not present

## 2019-05-10 DIAGNOSIS — R7309 Other abnormal glucose: Secondary | ICD-10-CM | POA: Diagnosis not present

## 2019-05-10 DIAGNOSIS — Z3481 Encounter for supervision of other normal pregnancy, first trimester: Secondary | ICD-10-CM | POA: Diagnosis not present

## 2019-05-11 DIAGNOSIS — R7309 Other abnormal glucose: Secondary | ICD-10-CM | POA: Diagnosis not present

## 2019-05-11 DIAGNOSIS — Z124 Encounter for screening for malignant neoplasm of cervix: Secondary | ICD-10-CM | POA: Diagnosis not present

## 2019-05-11 DIAGNOSIS — Z3481 Encounter for supervision of other normal pregnancy, first trimester: Secondary | ICD-10-CM | POA: Diagnosis not present

## 2019-05-11 LAB — OB RESULTS CONSOLE VARICELLA ZOSTER ANTIBODY, IGG: Varicella: IMMUNE

## 2019-05-11 LAB — OB RESULTS CONSOLE RPR: RPR: NONREACTIVE

## 2019-05-11 LAB — OB RESULTS CONSOLE RUBELLA ANTIBODY, IGM: Rubella: NON-IMMUNE/NOT IMMUNE

## 2019-05-11 LAB — OB RESULTS CONSOLE HEPATITIS B SURFACE ANTIGEN: Hepatitis B Surface Ag: NEGATIVE

## 2019-05-25 DIAGNOSIS — Z3481 Encounter for supervision of other normal pregnancy, first trimester: Secondary | ICD-10-CM | POA: Diagnosis not present

## 2019-07-26 DIAGNOSIS — Z23 Encounter for immunization: Secondary | ICD-10-CM | POA: Diagnosis not present

## 2019-07-26 DIAGNOSIS — Z3481 Encounter for supervision of other normal pregnancy, first trimester: Secondary | ICD-10-CM | POA: Diagnosis not present

## 2019-07-26 DIAGNOSIS — N898 Other specified noninflammatory disorders of vagina: Secondary | ICD-10-CM | POA: Diagnosis not present

## 2019-07-29 DIAGNOSIS — O99810 Abnormal glucose complicating pregnancy: Secondary | ICD-10-CM | POA: Diagnosis not present

## 2019-08-07 ENCOUNTER — Other Ambulatory Visit: Payer: Self-pay

## 2019-08-07 ENCOUNTER — Observation Stay
Admission: EM | Admit: 2019-08-07 | Discharge: 2019-08-08 | Disposition: A | Discharge status: Home / Self Care | Payer: Medicaid Other

## 2019-08-07 DIAGNOSIS — O99283 Endocrine, nutritional and metabolic diseases complicating pregnancy, third trimester: Secondary | ICD-10-CM | POA: Insufficient documentation

## 2019-08-07 DIAGNOSIS — O26899 Other specified pregnancy related conditions, unspecified trimester: Secondary | ICD-10-CM | POA: Diagnosis present

## 2019-08-07 DIAGNOSIS — O26893 Other specified pregnancy related conditions, third trimester: Principal | ICD-10-CM | POA: Insufficient documentation

## 2019-08-07 DIAGNOSIS — O99333 Smoking (tobacco) complicating pregnancy, third trimester: Secondary | ICD-10-CM | POA: Insufficient documentation

## 2019-08-07 DIAGNOSIS — R102 Pelvic and perineal pain: Secondary | ICD-10-CM | POA: Insufficient documentation

## 2019-08-07 DIAGNOSIS — Z3A30 30 weeks gestation of pregnancy: Secondary | ICD-10-CM | POA: Insufficient documentation

## 2019-08-07 DIAGNOSIS — Z882 Allergy status to sulfonamides status: Secondary | ICD-10-CM | POA: Insufficient documentation

## 2019-08-07 DIAGNOSIS — F17210 Nicotine dependence, cigarettes, uncomplicated: Secondary | ICD-10-CM | POA: Insufficient documentation

## 2019-08-07 DIAGNOSIS — Z880 Allergy status to penicillin: Secondary | ICD-10-CM | POA: Insufficient documentation

## 2019-08-07 DIAGNOSIS — E860 Dehydration: Secondary | ICD-10-CM | POA: Insufficient documentation

## 2019-08-07 NOTE — OB Triage Note (Addendum)
Pt is a G5P1 and [redacted]w[redacted]d presenting to L&D with c/o sharp pain on left side of abdomen that has "come and gone all day" since 1100 this morning and describes it as a dull/sharp pain. Pt rates pain anywhere from 4/10-8/10 with activity on a 0-10 pain scale. Pt currently rates pain 2/10. Pt denies VB and LOF. Pt reports positive fetal movement. Vital signs WDL. Monitors applied and assessing.

## 2019-08-08 DIAGNOSIS — R109 Unspecified abdominal pain: Secondary | ICD-10-CM | POA: Diagnosis present

## 2019-08-08 DIAGNOSIS — O26899 Other specified pregnancy related conditions, unspecified trimester: Secondary | ICD-10-CM | POA: Diagnosis present

## 2019-08-08 DIAGNOSIS — Z3A30 30 weeks gestation of pregnancy: Secondary | ICD-10-CM | POA: Diagnosis not present

## 2019-08-08 DIAGNOSIS — O99283 Endocrine, nutritional and metabolic diseases complicating pregnancy, third trimester: Secondary | ICD-10-CM | POA: Diagnosis not present

## 2019-08-08 DIAGNOSIS — Z880 Allergy status to penicillin: Secondary | ICD-10-CM | POA: Diagnosis not present

## 2019-08-08 DIAGNOSIS — O26893 Other specified pregnancy related conditions, third trimester: Secondary | ICD-10-CM | POA: Diagnosis not present

## 2019-08-08 DIAGNOSIS — F17210 Nicotine dependence, cigarettes, uncomplicated: Secondary | ICD-10-CM | POA: Diagnosis not present

## 2019-08-08 DIAGNOSIS — E860 Dehydration: Secondary | ICD-10-CM | POA: Diagnosis not present

## 2019-08-08 DIAGNOSIS — R102 Pelvic and perineal pain: Secondary | ICD-10-CM | POA: Diagnosis not present

## 2019-08-08 DIAGNOSIS — O99333 Smoking (tobacco) complicating pregnancy, third trimester: Secondary | ICD-10-CM | POA: Diagnosis not present

## 2019-08-08 DIAGNOSIS — Z882 Allergy status to sulfonamides status: Secondary | ICD-10-CM | POA: Diagnosis not present

## 2019-08-08 LAB — URINALYSIS, COMPLETE (UACMP) WITH MICROSCOPIC
Bacteria, UA: NONE SEEN
Bilirubin Urine: NEGATIVE
Glucose, UA: NEGATIVE mg/dL
Hgb urine dipstick: NEGATIVE
Ketones, ur: NEGATIVE mg/dL
Leukocytes,Ua: NEGATIVE
Nitrite: NEGATIVE
Protein, ur: 30 mg/dL — AB
Specific Gravity, Urine: 1.034 — ABNORMAL HIGH (ref 1.005–1.030)
pH: 5 (ref 5.0–8.0)

## 2019-08-08 MED ORDER — ACETAMINOPHEN 500 MG PO TABS
1000.0000 mg | ORAL_TABLET | Freq: Four times a day (QID) | ORAL | Status: DC | PRN
  Administered 2019-08-08: 1000 mg via ORAL
  Filled 2019-08-08: qty 2

## 2019-08-08 MED ORDER — CALCIUM CARBONATE ANTACID 500 MG PO CHEW: 2.0000 | CHEWABLE_TABLET | ORAL | Status: DC | PRN

## 2019-08-08 NOTE — OB Triage Note (Signed)
Discharge instructions reviewed with pt. Pt verbalized understanding. Pt stable upon departure. Pt denies any further needs.

## 2019-08-08 NOTE — Discharge Summary (Signed)
Virginia Mitchell is a 29 y.o. female. She is at [redacted]w[redacted]d gestation. Patient's last menstrual period was 01/07/2019 (approximate). Estimated Date of Delivery: 10/14/19  Prenatal care site: [facility]  Chief complaint: left lower abdominal pain  Location: left inguinal area Onset/timing: started earlier in the morning Duration: has come and gone all day Quality: dull/sharp Severity: 4-8/10, now 2/10 with rest  Aggravating or alleviating conditions: movement makes pain worse Associated signs/symptoms: Denies LOF, vaginal bleeding, or contractions.  Context: Reports that she has been doing a lot of activity today and pain has been worse.  Has improved now that she is resting.   S: Resting comfortably. no CTX, no VB.no LOF,  Active fetal movement.   Maternal Medical History:  Past Medical Hx:  has a past medical history of Anemia.    Past Surgical Hx:  has a past surgical history that includes Dilation and curettage of uterus.   Allergies  Allergen Reactions  . Sulfa Antibiotics Shortness Of Breath  . Penicillins Other (See Comments)    Reaction when little.      Prior to Admission medications   Medication Sig Start Date End Date Taking? Authorizing Provider  acetaminophen (TYLENOL) 500 MG tablet Take 500 mg by mouth every 6 (six) hours as needed.   Yes [provider]  Prenatal Vit-Fe Fumarate-FA (PRENATAL MULTIVITAMIN) TABS tablet Take 1 tablet by mouth daily at 12 noon.   Yes [provider]    Social History: She  reports that she has been smoking cigarettes. She has been smoking about 0.50 packs per day. She uses smokeless tobacco. She reports current alcohol use. She reports current drug use. Drug: Marijuana.  Family History: family history includes Asthma in her paternal grandmother; Diabetes in her paternal grandmother; Migraines in her paternal grandmother.   Review of Systems: A full review of systems was performed and negative except as noted  in the HPI.    O:  BP 107/61 (BP Location: Right Arm)   Pulse (!) 101   Temp 97.8 F (36.6 C) (Oral)   Resp 18   Ht 5' (1.524 m)   Wt 82.6 kg   LMP 01/07/2019 (Approximate)   SpO2 98%   BMI 35.54 kg/m  No results found for this or any previous visit (from the past 48 hour(s)).   Constitutional: NAD, AAOx3  HE/ENT: extraocular movements grossly intact, moist mucous membranes CV: RRR PULM: nl respiratory effort, CTABL     Abd: gravid, non-tender, non-distended, soft      Ext: Non-tender, Nonedmeatous   Psych: mood appropriate, speech normal Pelvic deferred  NST:  Baseline: 150 Variability: moderate Accelerations present x >2 Decelerations absent Toco: quiet  Time  Assessment: 29 y.o. [redacted]w[redacted]d here for antenatal surveillance during pregnancy.  Principle diagnosis: Round ligament pain, dehydration in pregnancy  Plan:  Labor: not present.   Fetal Wellbeing: Reassuring Cat 1 tracing.  Reactive NST   Abdominal binder to help support abdomen   PO hydration x 2 liters - reports feeling better   Pain significantly improved after rest and hydration  D/c home stable, precautions reviewed, follow-up as scheduled.   ----- Margaretmary Eddy, CNM Certified Nurse Midwife [facility]

## 2019-09-02 ENCOUNTER — Encounter: Payer: Self-pay | Admitting: Emergency Medicine

## 2019-09-02 ENCOUNTER — Observation Stay
Admission: EM | Admit: 2019-09-02 | Discharge: 2019-09-03 | Disposition: A | Discharge status: Home / Self Care | Payer: Medicaid Other | Attending: Obstetrics and Gynecology | Admitting: Obstetrics and Gynecology

## 2019-09-02 ENCOUNTER — Other Ambulatory Visit: Payer: Self-pay

## 2019-09-02 DIAGNOSIS — Z882 Allergy status to sulfonamides status: Secondary | ICD-10-CM | POA: Diagnosis not present

## 2019-09-02 DIAGNOSIS — W19XXXA Unspecified fall, initial encounter: Secondary | ICD-10-CM

## 2019-09-02 DIAGNOSIS — W182XXA Fall in (into) shower or empty bathtub, initial encounter: Secondary | ICD-10-CM | POA: Insufficient documentation

## 2019-09-02 DIAGNOSIS — R102 Pelvic and perineal pain: Secondary | ICD-10-CM | POA: Diagnosis not present

## 2019-09-02 DIAGNOSIS — O26893 Other specified pregnancy related conditions, third trimester: Secondary | ICD-10-CM | POA: Diagnosis not present

## 2019-09-02 DIAGNOSIS — O99891 Other specified diseases and conditions complicating pregnancy: Secondary | ICD-10-CM | POA: Diagnosis not present

## 2019-09-02 DIAGNOSIS — Y92002 Bathroom of unspecified non-institutional (private) residence single-family (private) house as the place of occurrence of the external cause: Secondary | ICD-10-CM | POA: Insufficient documentation

## 2019-09-02 DIAGNOSIS — Z3A34 34 weeks gestation of pregnancy: Secondary | ICD-10-CM | POA: Insufficient documentation

## 2019-09-02 DIAGNOSIS — Y93E1 Activity, personal bathing and showering: Secondary | ICD-10-CM | POA: Insufficient documentation

## 2019-09-02 DIAGNOSIS — O99333 Smoking (tobacco) complicating pregnancy, third trimester: Secondary | ICD-10-CM | POA: Diagnosis not present

## 2019-09-02 DIAGNOSIS — M25551 Pain in right hip: Secondary | ICD-10-CM | POA: Insufficient documentation

## 2019-09-02 DIAGNOSIS — F17210 Nicotine dependence, cigarettes, uncomplicated: Secondary | ICD-10-CM | POA: Diagnosis not present

## 2019-09-02 DIAGNOSIS — Z880 Allergy status to penicillin: Secondary | ICD-10-CM | POA: Insufficient documentation

## 2019-09-02 DIAGNOSIS — Z3490 Encounter for supervision of normal pregnancy, unspecified, unspecified trimester: Secondary | ICD-10-CM

## 2019-09-02 NOTE — ED Triage Notes (Signed)
Patient states that she is [redacted] weeks pregnant and fell getting out of the shower. Patient with complaint of right hip pain. Patient denies abdominal pain or vaginal bleeding. Patient states that she is still feeling baby move.

## 2019-09-03 ENCOUNTER — Encounter: Payer: Self-pay | Admitting: Obstetrics and Gynecology

## 2019-09-03 DIAGNOSIS — O9A213 Injury, poisoning and certain other consequences of external causes complicating pregnancy, third trimester: Secondary | ICD-10-CM | POA: Diagnosis not present

## 2019-09-03 DIAGNOSIS — W182XXA Fall in (into) shower or empty bathtub, initial encounter: Secondary | ICD-10-CM | POA: Diagnosis not present

## 2019-09-03 DIAGNOSIS — Z3490 Encounter for supervision of normal pregnancy, unspecified, unspecified trimester: Secondary | ICD-10-CM

## 2019-09-03 DIAGNOSIS — Z043 Encounter for examination and observation following other accident: Secondary | ICD-10-CM | POA: Diagnosis not present

## 2019-09-03 DIAGNOSIS — Z0379 Encounter for other suspected maternal and fetal conditions ruled out: Secondary | ICD-10-CM | POA: Diagnosis not present

## 2019-09-03 MED ORDER — LIDOCAINE 5 % EX PTCH: 1.0000 | MEDICATED_PATCH | Freq: Two times a day (BID) | CUTANEOUS | 0 refills | Status: AC

## 2019-09-03 MED ORDER — ACETAMINOPHEN 500 MG PO TABS
1000.0000 mg | ORAL_TABLET | Freq: Once | ORAL | Status: AC
  Administered 2019-09-03: 1000 mg via ORAL
  Filled 2019-09-03: qty 2

## 2019-09-03 MED ORDER — LIDOCAINE 5 % EX PTCH
1.0000 | MEDICATED_PATCH | CUTANEOUS | Status: DC
  Administered 2019-09-03: 1 via TRANSDERMAL
  Filled 2019-09-03: qty 1

## 2019-09-03 NOTE — Progress Notes (Addendum)
CNM reviewed strip. Verbal order from CNM to discharge. Orders placed. Discharge instructions reviewed with pt- questions answered and pt states no further needs at this time. Pt D/C home with boyfriend. Pt stable at discharge.

## 2019-09-03 NOTE — Discharge Summary (Signed)
Patient ID: Virginia Mitchell MRN: [id number] DOB/AGE: 06/11/1990 29 y.o.  Admit date: 09/02/2019 Discharge date: 09/04/2019  Admission Diagnoses: Fall in the shower at 1100 this am  Discharge Diagnoses: Reactive NST  Prenatal Procedures: NST in L&D  Consults: none  Significant Diagnostic Studies:  No results found for this or any previous visit (from the past 168 hour(s)).  Treatments: none in L&D  Hospital Course:  This is a 29 y.o. [id number] with IUP at [redacted]w[redacted]d admitted to L&D for fetal monitoring following a fall in the shower.  Pt was evaluated in the ED for hip pain and cleared then sent to L&D for fetal evaluation.   No leaking of fluid and no bleeding. She was observed, fetal heart rate monitoring remained reassuring, and she had no signs/symptoms of preterm labor or other maternal-fetal concerns.  NST reactive.  She was deemed stable for discharge to home with outpatient follow up.  Discharge Physical Exam:  BP 119/63 (BP Location: Left Arm)   Pulse 95   Temp 97.9 F (36.6 C) (Oral)   Resp 16   Ht 5' (1.524 m)   Wt 83 kg   LMP 01/07/2019 (Approximate)   SpO2 99%   BMI 35.74 kg/m   General: NAD CV: RRR Pulm: nl effort ABD: s/nd/nt, gravid DVT Evaluation: LE non-ttp, no evidence of DVT on exam.   NST: FHR baseline: 135 bpm Variability: moderate Accelerations: yes Decelerations: none Time: 60 minutes Category/reactivity: reactive TOCO: quiet   Discharge Condition: Stable  Disposition:  Discharge disposition: 01-Home or Self Care        Allergies as of 09/03/2019      Reactions   Sulfa Antibiotics Shortness Of Breath   Penicillins Other (See Comments)   Reaction when little.       Medication List    TAKE these medications   lidocaine 5 % Commonly known as: Lidoderm Place 1 patch onto the skin every 12 (twelve) hours. Remove & Discard patch within 12 hours or as directed by MD     ASK your doctor about these medications   acetaminophen 500 MG  tablet Commonly known as: TYLENOL Take 500 mg by mouth every 6 (six) hours as needed.   pantoprazole 40 MG tablet Commonly known as: PROTONIX Take 40 mg by mouth daily.   prenatal multivitamin Tabs tablet Take 1 tablet by mouth daily at 12 noon.      Follow-up Information    [facility] EMERGENCY DEPARTMENT.   Specialty: Emergency Medicine Why: If symptoms worsen Contact information: [address] [id number] [place] [address] [phone]       Haroldine Laws, CNM. Schedule an appointment as soon as possible for a visit in 2 day(s).   Specialty: Certified Nurse Midwife Contact information: [address] [place] [address] [phone]           Signed:  Quillian Quince 09/04/2019 3:50 PM

## 2019-09-03 NOTE — ED Provider Notes (Signed)
[facility] Emergency Department Provider Note   ____________________________________________   First MD Initiated Contact with Patient 09/02/19 2333     (approximate)  I have reviewed the triage vital signs and the nursing notes.   HISTORY  Chief Complaint Fall    HPI Virginia Mitchell is a 29 y.o. female, G2 P1-0-0-1 at approximately 34 weeks of pregnancy, presents to the ED complaining of fall.  Patient reports that earlier today, around noon, she tripped and fell while getting out of the shower.  She reports hitting her bottom and her right hip and had immediate onset of pain near her right hip.  She has been able to walk since then but describes significant discomfort not alleviated by Tylenol.  She last took a dose of Tylenol around 2 PM this afternoon but since pain persisted, she wanted to get checked out in the ED.  Pain is primarily at her right hip but she also complains of discomfort in her pelvic area, she denies any abdominal pain or vaginal bleeding.        Past Medical History:  Diagnosis Date  . Anemia     Patient Active Problem List   Diagnosis Date Noted  . Abdominal pain affecting pregnancy 08/08/2019    Past Surgical History:  Procedure Laterality Date  . DILATION AND CURETTAGE OF UTERUS      Prior to Admission medications   Medication Sig Start Date End Date Taking? Authorizing Provider  acetaminophen (TYLENOL) 500 MG tablet Take 500 mg by mouth every 6 (six) hours as needed.    [provider]  lidocaine (LIDODERM) 5 % Place 1 patch onto the skin every 12 (twelve) hours. Remove & Discard patch within 12 hours or as directed by MD 09/03/19 09/02/20  Chesley Noon, MD  Prenatal Vit-Fe Fumarate-FA (PRENATAL MULTIVITAMIN) TABS tablet Take 1 tablet by mouth daily at 12 noon.    [provider]    Allergies Sulfa antibiotics and Penicillins  Family History  Problem Relation Age of Onset  . Diabetes Paternal  Grandmother   . Asthma Paternal Grandmother   . Migraines Paternal Grandmother   . Breast cancer Neg Hx   . Ovarian cancer Neg Hx   . Colon cancer Neg Hx     Social History Social History   Tobacco Use  . Smoking status: Current Every Day Smoker    Packs/day: 0.50    Types: Cigarettes  . Smokeless tobacco: Never Used  Substance Use Topics  . Alcohol use: Not Currently  . Drug use: Not Currently    Types: Marijuana    Review of Systems  Constitutional: No fever/chills Eyes: No visual changes. ENT: No sore throat. Cardiovascular: Denies chest pain. Respiratory: Denies shortness of breath. Gastrointestinal: No abdominal pain.  No nausea, no vomiting.  No diarrhea.  No constipation. Genitourinary: Negative for dysuria.  Positive for pelvic pain. Musculoskeletal: Negative for back pain.  Positive for right hip pain. Skin: Negative for rash. Neurological: Negative for headaches, focal weakness or numbness.  ____________________________________________   PHYSICAL EXAM:  VITAL SIGNS: ED Triage Vitals [09/02/19 2208]  Enc Vitals Group     BP 115/64     Pulse Rate (!) 114     Resp 18     Temp 98.2 F (36.8 C)     Temp Source Oral     SpO2 100 %     Weight 183 lb (83 kg)     Height 5' (1.524 m)  Head Circumference      Peak Flow      Pain Score 10     Pain Loc      Pain Edu?      Excl. in [place]?     Constitutional: Alert and oriented. Eyes: Conjunctivae are normal. Head: Atraumatic. Nose: No congestion/rhinnorhea. Mouth/Throat: Mucous membranes are moist. Neck: Normal ROM Cardiovascular: Normal rate, regular rhythm. Grossly normal heart sounds. Respiratory: Normal respiratory effort.  No retractions. Lungs CTAB. Gastrointestinal: Gravid abdomen, soft and nontender. No distention. Genitourinary: deferred Musculoskeletal: No lower extremity tenderness nor edema.  Diffuse tenderness to right hip with no obvious deformity.  Range of motion intact to right hip  with mild discomfort.  No tenderness to left hip, bilateral knees, or bilateral ankles. Neurologic:  Normal speech and language. No gross focal neurologic deficits are appreciated. Skin:  Skin is warm, dry and intact. No rash noted. Psychiatric: Mood and affect are normal. Speech and behavior are normal.  ____________________________________________   LABS (all labs ordered are listed, but only abnormal results are displayed)  Labs Reviewed - No data to display   PROCEDURES  Procedure(s) performed (including Critical Care):  Procedures   ____________________________________________   INITIAL IMPRESSION / ASSESSMENT AND PLAN / ED COURSE       29 year old female, G2 P1-0-0-1 at approximately 34 weeks of pregnancy presents to the ED complaining of right hip and pelvic pain following a fall around noon this afternoon.  She has diffuse tenderness to her right hip but there is no obvious deformity and range of motion to her right hip is intact with only mild discomfort.  I have very low suspicion for any acute bony injury and risk of radiation related to x-ray seems to outweigh benefit in this case.  We will treat symptomatically with Tylenol and Lidoderm patch.  She has appropriate fetal heart tones and no abdominal tenderness, but does complain of some pelvic pain.  She would benefit from evaluation in L&D for possible fetal monitoring given pelvic pain and will be moved there from the ED.      ____________________________________________   FINAL CLINICAL IMPRESSION(S) / ED DIAGNOSES  Final diagnoses:  Fall, initial encounter  Right hip pain  Pelvic pain     ED Discharge Orders         Ordered    lidocaine (LIDODERM) 5 %  Every 12 hours     09/03/19 0038           Note:  This document was prepared using Dragon voice recognition software and may include unintentional dictation errors.   Blake Divine, MD 09/03/19 [phone]

## 2019-09-03 NOTE — Discharge Instructions (Signed)
Fetal Movement Counts Patient Name: ________________________________________________ Patient Due Date: ____________________ What is a fetal movement count?  A fetal movement count is the number of times that you feel your baby move during a certain amount of time. This may also be called a fetal kick count. A fetal movement count is recommended for every pregnant woman. You may be asked to start counting fetal movements as early as week 28 of your pregnancy. Pay attention to when your baby is most active. You may notice your baby's sleep and wake cycles. You may also notice things that make your baby move more. You should do a fetal movement count:  When your baby is normally most active.  At the same time each day. A good time to count movements is while you are resting, after having something to eat and drink. How do I count fetal movements? 1. Find a quiet, comfortable area. Sit, or lie down on your side. 2. Write down the date, the start time and stop time, and the number of movements that you felt between those two times. Take this information with you to your health care visits. 3. Write down your start time when you feel the first movement. 4. Count kicks, flutters, swishes, rolls, and jabs. You should feel at least 10 movements. 5. You may stop counting after you have felt 10 movements, or if you have been counting for 2 hours. Write down the stop time. 6. If you do not feel 10 movements in 2 hours, contact your health care provider for further instructions. Your health care provider may want to do additional tests to assess your baby's well-being. Contact a health care provider if:  You feel fewer than 10 movements in 2 hours.  Your baby is not moving like he or she usually does. Date: ____________ Start time: ____________ Stop time: ____________ Movements: ____________ Date: ____________ Start time: ____________ Stop time: ____________ Movements: ____________ Date: ____________  Start time: ____________ Stop time: ____________ Movements: ____________ Date: ____________ Start time: ____________ Stop time: ____________ Movements: ____________ Date: ____________ Start time: ____________ Stop time: ____________ Movements: ____________ Date: ____________ Start time: ____________ Stop time: ____________ Movements: ____________ Date: ____________ Start time: ____________ Stop time: ____________ Movements: ____________ Date: ____________ Start time: ____________ Stop time: ____________ Movements: ____________ Date: ____________ Start time: ____________ Stop time: ____________ Movements: ____________ This information is not intended to replace advice given to you by your health care provider. Make sure you discuss any questions you have with your health care provider. Document Revised: 12/09/2018 Document Reviewed: 12/09/2018 Elsevier Patient Education  2020 [employer]  

## 2019-09-03 NOTE — OB Triage Note (Signed)
Pt is a [id number] and [redacted]w[redacted]d sent to L&D after being medically cleared in the ED. Pt stated she fell while getting out of the shower around 12:00pm on 09/02/19. She states she fell on her right hip. Pt rates pain 0/10 when stationary but 6/10 when mobile. Abdomen palpated soft and non-tender when assessed. Pt reports positive fetal movement and denies LOF. Vital signs WDL. Monitors applied and assessing. No further needs at this time.

## 2019-10-13 ENCOUNTER — Other Ambulatory Visit: Payer: Self-pay

## 2019-10-13 ENCOUNTER — Encounter: Payer: Self-pay | Admitting: Obstetrics and Gynecology

## 2019-10-13 ENCOUNTER — Inpatient Hospital Stay
Admission: AD | Admit: 2019-10-13 | Discharge: 2019-10-16 | DRG: 806 | Disposition: A | Discharge status: Home / Self Care | Payer: Medicaid Other | Attending: Obstetrics and Gynecology | Admitting: Obstetrics and Gynecology

## 2019-10-13 DIAGNOSIS — O99824 Streptococcus B carrier state complicating childbirth: Secondary | ICD-10-CM | POA: Diagnosis not present

## 2019-10-13 DIAGNOSIS — Z20822 Contact with and (suspected) exposure to covid-19: Secondary | ICD-10-CM | POA: Diagnosis present

## 2019-10-13 DIAGNOSIS — O2243 Hemorrhoids in pregnancy, third trimester: Secondary | ICD-10-CM | POA: Diagnosis present

## 2019-10-13 DIAGNOSIS — F17210 Nicotine dependence, cigarettes, uncomplicated: Secondary | ICD-10-CM | POA: Diagnosis not present

## 2019-10-13 DIAGNOSIS — O99334 Smoking (tobacco) complicating childbirth: Secondary | ICD-10-CM | POA: Diagnosis not present

## 2019-10-13 DIAGNOSIS — D62 Acute posthemorrhagic anemia: Secondary | ICD-10-CM | POA: Diagnosis not present

## 2019-10-13 DIAGNOSIS — Z3A40 40 weeks gestation of pregnancy: Secondary | ICD-10-CM | POA: Diagnosis not present

## 2019-10-13 DIAGNOSIS — O9081 Anemia of the puerperium: Secondary | ICD-10-CM | POA: Diagnosis not present

## 2019-10-13 DIAGNOSIS — O326XX0 Maternal care for compound presentation, not applicable or unspecified: Principal | ICD-10-CM | POA: Diagnosis present

## 2019-10-13 DIAGNOSIS — Z91040 Latex allergy status: Secondary | ICD-10-CM | POA: Diagnosis not present

## 2019-10-13 DIAGNOSIS — Z880 Allergy status to penicillin: Secondary | ICD-10-CM

## 2019-10-13 DIAGNOSIS — O26893 Other specified pregnancy related conditions, third trimester: Secondary | ICD-10-CM | POA: Diagnosis not present

## 2019-10-13 LAB — COMPREHENSIVE METABOLIC PANEL
ALT: 11 U/L (ref 0–44)
AST: 17 U/L (ref 15–41)
Albumin: 3.1 g/dL — ABNORMAL LOW (ref 3.5–5.0)
Alkaline Phosphatase: 161 U/L — ABNORMAL HIGH (ref 38–126)
Anion gap: 8 (ref 5–15)
BUN: 7 mg/dL (ref 6–20)
CO2: 22 mmol/L (ref 22–32)
Calcium: 8.9 mg/dL (ref 8.9–10.3)
Chloride: 104 mmol/L (ref 98–111)
Creatinine, Ser: 0.44 mg/dL (ref 0.44–1.00)
GFR calc Af Amer: >60 mL/min (ref >60)
GFR calc non Af Amer: >60 mL/min (ref >60)
Glucose, Bld: 76 mg/dL (ref 70–99)
Potassium: 3.8 mmol/L (ref 3.5–5.1)
Sodium: 134 mmol/L — ABNORMAL LOW (ref 135–145)
Total Bilirubin: 0.6 mg/dL (ref 0.3–1.2)
Total Protein: 6.6 g/dL (ref 6.5–8.1)

## 2019-10-13 LAB — CBC
HCT: 30.8 % — ABNORMAL LOW (ref 36.0–46.0)
Hemoglobin: 10.4 g/dL — ABNORMAL LOW (ref 12.0–15.0)
MCH: 30.2 pg (ref 26.0–34.0)
MCHC: 33.8 g/dL (ref 30.0–36.0)
MCV: 89.5 fL (ref 80.0–100.0)
Platelets: 377 10*3/uL (ref 150–400)
RBC: 3.44 MIL/uL — ABNORMAL LOW (ref 3.87–5.11)
RDW: 13.9 % (ref 11.5–15.5)
WBC: 16.7 10*3/uL — ABNORMAL HIGH (ref 4.0–10.5)
nRBC: 0 % (ref 0.0–0.2)

## 2019-10-13 LAB — URINE DRUG SCREEN, QUALITATIVE (ARMC ONLY)
Amphetamines, Ur Screen: NOT DETECTED
Barbiturates, Ur Screen: NOT DETECTED
Benzodiazepine, Ur Scrn: NOT DETECTED
Cannabinoid 50 Ng, Ur ~~LOC~~: NOT DETECTED
Cocaine Metabolite,Ur ~~LOC~~: NOT DETECTED
MDMA (Ecstasy)Ur Screen: NOT DETECTED
Methadone Scn, Ur: NOT DETECTED
Opiate, Ur Screen: NOT DETECTED
Phencyclidine (PCP) Ur S: NOT DETECTED
Tricyclic, Ur Screen: NOT DETECTED

## 2019-10-13 LAB — TYPE AND SCREEN
ABO/RH(D): O POS
Antibody Screen: NEGATIVE

## 2019-10-13 LAB — CHLAMYDIA/NGC RT PCR (ARMC ONLY)
Chlamydia Tr: NOT DETECTED
N gonorrhoeae: NOT DETECTED

## 2019-10-13 LAB — SARS CORONAVIRUS 2 BY RT PCR (HOSPITAL ORDER, PERFORMED IN ~~LOC~~ HOSPITAL LAB): SARS Coronavirus 2: NEGATIVE

## 2019-10-13 LAB — RAPID HIV SCREEN (HIV 1/2 AB+AG)
HIV 1/2 Antibodies: NONREACTIVE
HIV-1 P24 Antigen - HIV24: NONREACTIVE

## 2019-10-13 LAB — OB RESULTS CONSOLE HIV ANTIBODY (ROUTINE TESTING): HIV: NONREACTIVE

## 2019-10-13 LAB — GROUP B STREP BY PCR: Group B strep by PCR: POSITIVE — AB

## 2019-10-13 MED ORDER — LACTATED RINGERS IV BOLUS
500.0000 mL | Freq: Once | INTRAVENOUS | Status: AC
  Administered 2019-10-13: 500 mL via INTRAVENOUS

## 2019-10-13 MED ORDER — OXYTOCIN 10 UNIT/ML IJ SOLN
INTRAMUSCULAR | Status: AC
  Filled 2019-10-13: qty 2

## 2019-10-13 MED ORDER — ONDANSETRON HCL 4 MG/2ML IJ SOLN: 4.0000 mg | Freq: Four times a day (QID) | INTRAMUSCULAR | Status: DC | PRN

## 2019-10-13 MED ORDER — OXYTOCIN-SODIUM CHLORIDE 30-0.9 UT/500ML-% IV SOLN
2.5000 [IU]/h | INTRAVENOUS | Status: DC
  Administered 2019-10-14: 2.5 [IU]/h via INTRAVENOUS
  Filled 2019-10-13: qty 1000

## 2019-10-13 MED ORDER — LACTATED RINGERS IV SOLN: INTRAVENOUS | Status: DC

## 2019-10-13 MED ORDER — LACTATED RINGERS IV SOLN
500.0000 mL | INTRAVENOUS | Status: DC | PRN
  Administered 2019-10-13: 500 mL via INTRAVENOUS
  Administered 2019-10-14: 250 mL via INTRAVENOUS

## 2019-10-13 MED ORDER — SOD CITRATE-CITRIC ACID 500-334 MG/5ML PO SOLN: 30.0000 mL | ORAL | Status: DC | PRN

## 2019-10-13 MED ORDER — ACETAMINOPHEN 325 MG PO TABS: 650.0000 mg | ORAL_TABLET | ORAL | Status: DC | PRN

## 2019-10-13 MED ORDER — OXYTOCIN BOLUS FROM INFUSION
500.0000 mL | Freq: Once | INTRAVENOUS | Status: AC
  Administered 2019-10-14: 500 mL via INTRAVENOUS

## 2019-10-13 MED ORDER — AMMONIA AROMATIC IN INHA
RESPIRATORY_TRACT | Status: AC
  Filled 2019-10-13: qty 10

## 2019-10-13 MED ORDER — LIDOCAINE HCL (PF) 1 % IJ SOLN
30.0000 mL | INTRAMUSCULAR | Status: DC | PRN
  Filled 2019-10-13: qty 30

## 2019-10-13 MED ORDER — FENTANYL CITRATE (PF) 100 MCG/2ML IJ SOLN: 50.0000 ug | INTRAMUSCULAR | Status: DC | PRN

## 2019-10-13 MED ORDER — MISOPROSTOL 200 MCG PO TABS
ORAL_TABLET | ORAL | Status: AC
  Filled 2019-10-13: qty 4

## 2019-10-13 NOTE — OB Triage Note (Signed)
Pt G5P1 [redacted]w[redacted]d presents to birthplace through ED complaining of contractions coming every few minutes. States no vag bleeding, some milky white discharge, no other LOF, states she feels baby move good. Rating contractions 7-8/10. Monitors applied and assessing. FHT 150 at 2016.

## 2019-10-14 ENCOUNTER — Encounter: Payer: Self-pay | Admitting: Obstetrics and Gynecology

## 2019-10-14 ENCOUNTER — Inpatient Hospital Stay: Payer: Medicaid Other | Admitting: Anesthesiology

## 2019-10-14 LAB — RPR: RPR Ser Ql: NONREACTIVE

## 2019-10-14 MED ORDER — ONDANSETRON HCL 4 MG/2ML IJ SOLN: 4.0000 mg | INTRAMUSCULAR | Status: DC | PRN

## 2019-10-14 MED ORDER — PRENATAL MULTIVITAMIN CH
1.0000 | ORAL_TABLET | Freq: Every day | ORAL | Status: DC
  Administered 2019-10-14 – 2019-10-15 (×2): 1 via ORAL
  Filled 2019-10-14 (×2): qty 1

## 2019-10-14 MED ORDER — FENTANYL 2.5 MCG/ML W/ROPIVACAINE 0.15% IN NS 100 ML EPIDURAL (ARMC)
EPIDURAL | Status: AC
  Filled 2019-10-14: qty 100

## 2019-10-14 MED ORDER — ZOLPIDEM TARTRATE 5 MG PO TABS: 5.0000 mg | ORAL_TABLET | Freq: Every evening | ORAL | Status: DC | PRN

## 2019-10-14 MED ORDER — ONDANSETRON HCL 4 MG PO TABS: 4.0000 mg | ORAL_TABLET | ORAL | Status: DC | PRN

## 2019-10-14 MED ORDER — EPHEDRINE 5 MG/ML INJ: 10.0000 mg | INTRAVENOUS | Status: DC | PRN

## 2019-10-14 MED ORDER — OXYTOCIN-SODIUM CHLORIDE 30-0.9 UT/500ML-% IV SOLN
1.0000 m[IU]/min | INTRAVENOUS | Status: DC
  Administered 2019-10-14: 2 m[IU]/min via INTRAVENOUS

## 2019-10-14 MED ORDER — SODIUM CHLORIDE 0.9 % IV SOLN
INTRAVENOUS | Status: DC | PRN
  Administered 2019-10-14 (×2): 5 mL via EPIDURAL

## 2019-10-14 MED ORDER — BENZOCAINE-MENTHOL 20-0.5 % EX AERO
1.0000 "application " | INHALATION_SPRAY | CUTANEOUS | Status: DC | PRN
  Administered 2019-10-14 (×2): 1 via TOPICAL
  Filled 2019-10-14 (×3): qty 56

## 2019-10-14 MED ORDER — WITCH HAZEL-GLYCERIN EX PADS
1.0000 "application " | MEDICATED_PAD | CUTANEOUS | Status: DC | PRN
  Administered 2019-10-14: 1 via TOPICAL
  Filled 2019-10-14 (×2): qty 100

## 2019-10-14 MED ORDER — DIPHENHYDRAMINE HCL 25 MG PO CAPS: 25.0000 mg | ORAL_CAPSULE | Freq: Four times a day (QID) | ORAL | Status: DC | PRN

## 2019-10-14 MED ORDER — SIMETHICONE 80 MG PO CHEW: 80.0000 mg | CHEWABLE_TABLET | ORAL | Status: DC | PRN

## 2019-10-14 MED ORDER — LIDOCAINE-EPINEPHRINE (PF) 1.5 %-1:200000 IJ SOLN
INTRAMUSCULAR | Status: DC | PRN
  Administered 2019-10-14: 3 mL via EPIDURAL

## 2019-10-14 MED ORDER — FENTANYL 2.5 MCG/ML W/ROPIVACAINE 0.15% IN NS 100 ML EPIDURAL (ARMC)
EPIDURAL | Status: DC | PRN
  Administered 2019-10-14: 12 mL/h via EPIDURAL

## 2019-10-14 MED ORDER — SENNOSIDES-DOCUSATE SODIUM 8.6-50 MG PO TABS
2.0000 | ORAL_TABLET | ORAL | Status: DC
  Administered 2019-10-14 – 2019-10-15 (×2): 2 via ORAL
  Filled 2019-10-14 (×2): qty 2

## 2019-10-14 MED ORDER — LACTATED RINGERS IV SOLN: 500.0000 mL | Freq: Once | INTRAVENOUS | Status: DC

## 2019-10-14 MED ORDER — FENTANYL 2.5 MCG/ML W/ROPIVACAINE 0.15% IN NS 100 ML EPIDURAL (ARMC): 12.0000 mL/h | EPIDURAL | Status: DC

## 2019-10-14 MED ORDER — COCONUT OIL OIL
1.0000 "application " | TOPICAL_OIL | Status: DC | PRN
  Administered 2019-10-14: 1 via TOPICAL
  Filled 2019-10-14: qty 120

## 2019-10-14 MED ORDER — ACETAMINOPHEN 325 MG PO TABS
650.0000 mg | ORAL_TABLET | ORAL | Status: DC | PRN
  Administered 2019-10-14 (×4): 650 mg via ORAL
  Filled 2019-10-14 (×6): qty 2

## 2019-10-14 MED ORDER — FERROUS SULFATE 325 (65 FE) MG PO TABS
325.0000 mg | ORAL_TABLET | Freq: Two times a day (BID) | ORAL | Status: DC
  Administered 2019-10-14 – 2019-10-16 (×4): 325 mg via ORAL
  Filled 2019-10-14 (×4): qty 1

## 2019-10-14 MED ORDER — MEASLES, MUMPS & RUBELLA VAC IJ SOLR
0.5000 mL | Freq: Once | INTRAMUSCULAR | Status: DC
  Filled 2019-10-14: qty 0.5

## 2019-10-14 MED ORDER — CEFAZOLIN SODIUM-DEXTROSE 2-4 GM/100ML-% IV SOLN
2.0000 g | Freq: Once | INTRAVENOUS | Status: AC
  Administered 2019-10-14: 2 g via INTRAVENOUS
  Filled 2019-10-14 (×2): qty 100

## 2019-10-14 MED ORDER — LIDOCAINE HCL (PF) 1 % IJ SOLN
INTRAMUSCULAR | Status: DC | PRN
  Administered 2019-10-14: 3 mL via SUBCUTANEOUS

## 2019-10-14 MED ORDER — DIBUCAINE (PERIANAL) 1 % EX OINT
1.0000 "application " | TOPICAL_OINTMENT | CUTANEOUS | Status: DC | PRN
  Filled 2019-10-14: qty 28

## 2019-10-14 MED ORDER — PHENYLEPHRINE 40 MCG/ML (10ML) SYRINGE FOR IV PUSH (FOR BLOOD PRESSURE SUPPORT): 80.0000 ug | PREFILLED_SYRINGE | INTRAVENOUS | Status: DC | PRN

## 2019-10-14 MED ORDER — IBUPROFEN 600 MG PO TABS
600.0000 mg | ORAL_TABLET | Freq: Four times a day (QID) | ORAL | Status: DC
  Administered 2019-10-14 – 2019-10-16 (×8): 600 mg via ORAL
  Filled 2019-10-14 (×10): qty 1

## 2019-10-14 MED ORDER — TERBUTALINE SULFATE 1 MG/ML IJ SOLN: 0.2500 mg | Freq: Once | INTRAMUSCULAR | Status: DC | PRN

## 2019-10-14 MED ORDER — DIPHENHYDRAMINE HCL 50 MG/ML IJ SOLN: 12.5000 mg | INTRAMUSCULAR | Status: DC | PRN

## 2019-10-14 MED ORDER — CEFAZOLIN SODIUM-DEXTROSE 1-4 GM/50ML-% IV SOLN: 1.0000 g | Freq: Three times a day (TID) | INTRAVENOUS | Status: DC

## 2019-10-14 NOTE — Discharge Summary (Signed)
Obstetrical Discharge Summary  Patient Name: Virginia Mitchell DOB: 26-Jan-1991 MRN: [id number]  Date of Admission: 10/13/2019 Date of Delivery: 10/14/19 Delivered by: Hassan Buckler CNM Date of Discharge: 10/16/2019  Primary OB: [facility]  [id number] last menstrual period was 01/07/2019 (approximate). EDC Estimated Date of Delivery: 10/14/19 Gestational Age at Delivery: [redacted]w[redacted]d  Antepartum complications:  1. BMI > 30  P/C, A1C and CMP drawn at NOB  ASA started at 17 week- reviewed dosing at 28wks 2. Smoker  1/2 PPD as of 05/10/19  5-6 of the "shorts"/day as of 06/28/19; no change as of 07/26/19 3. Anemia  Hgb 9.9 at 28wks- advised iron supplement 4. Impaired glucose tolerance  Elevated 1hr GTT 143;   3hr GTT: 126-152-101-100  5. Rubella Non-Immune  Advise vaccine postpartum 6. Lapse in prenatal care, last seen at 28wks  Admitting Diagnosis: labor Secondary Diagnosis: SVD, meconium  Patient Active Problem List   Diagnosis Date Noted  . Limited prenatal care in third trimester 10/13/2019  . Pregnancy 09/03/2019  . Abdominal pain affecting pregnancy 08/08/2019    Augmentation: Pitocin Complications: None Intrapartum complications/course: see delivery note Date of Delivery: 10/14/19 Delivered By: RHassan BucklerCNM Delivery Type: spontaneous vaginal delivery Anesthesia: epidural Placenta: spontaneous Laceration: none Episiotomy: none Newborn Data: Live born female  Birth Weight:   APGAR: 864 9  Newborn Delivery   Birth date/time: 10/14/2019 06:15:00 Delivery type: Vaginal, Spontaneous     Postpartum Procedures: None   Edinburgh:  Edinburgh Postnatal Depression Scale Screening Tool 10/15/2019 10/15/2019 10/14/2019  I have been able to laugh and see the funny side of things. 0 (No Data) (No Data)  I have looked forward with enjoyment to things. 0 - -  I have blamed myself unnecessarily when things went wrong. 0 - -  I have been anxious or worried for  no good reason. 0 - -  I have felt scared or panicky for no good reason. 0 - -  Things have been getting on top of me. 0 - -  I have been so unhappy that I have had difficulty sleeping. 0 - -  I have felt sad or miserable. 0 - -  I have been so unhappy that I have been crying. 0 - -  The thought of harming myself has occurred to me. 0 - -  Edinburgh Postnatal Depression Scale Total 0 - -      Post partum course:  Patient had an uncomplicated postpartum course.  By time of discharge on PPD#2, her pain was controlled on oral pain medications; she had appropriate lochia and was ambulating, voiding without difficulty and tolerating regular diet.  She was deemed stable for discharge to home.     Discharge Physical Exam:  BP 110/65 (BP Location: Right Arm)   Pulse 82   Temp 98.6 F (37 C) (Oral)   Resp 18   Ht 5' (1.524 m)   Wt 86.2 kg   LMP 01/07/2019 (Approximate)   SpO2 100%   Breastfeeding Unknown   BMI 37.11 kg/m   General: NAD CV: RRR Pulm: CTABL, nl effort ABD: s/nd/nt, fundus firm and below the umbilicus Lochia: moderate Perineum: intact DVT Evaluation: LE non-ttp, no evidence of DVT on exam.  Hemoglobin  Date Value Ref Range Status  10/15/2019 9.2 (L) 12.0 - 15.0 g/dL Final   HGB  Date Value Ref Range Status  02/08/2013 13.5 12.0 - 16.0 g/dL Final   HCT  Date Value Ref Range Status  10/15/2019 28.2 (L)  36 - 46 % Final  02/09/2013 31.9 (L) 35.0 - 47.0 % Final     Disposition: stable, discharge to home. Baby Feeding: breastmilk Baby Disposition: home with mom  Rh Immune globulin given: n/a Rubella vaccine given: Needs prior to DC Varicella vaccine given: immune Tdap vaccine given in AP or PP setting: 07/26/19 Flu vaccine given in AP or PP setting: declined  Contraception: Considering vasectomy   Prenatal Labs:  Blood type/Rh  O  POS  Antibody screen neg  Rubella NON-Immune  Varicella Immune  RPR NR  HBsAg Neg  HIV NR  GC neg  Chlamydia neg   Genetic screening negative  1 hour GTT  143  3 hour GTT 126-152-101-100  GBS  Pos by PCR on admission      Plan:  LENITA PEREGRINA was discharged to home in good condition. Follow-up appointment with delivering provider in 6 weeks.  Discharge Medications: Allergies as of 10/16/2019      Reactions   Latex Hives   Sulfa Antibiotics Shortness Of Breath   Penicillins Other (See Comments)   Reaction when little.       Medication List    TAKE these medications   acetaminophen 500 MG tablet Commonly known as: TYLENOL Take 500 mg by mouth every 6 (six) hours as needed.   dibucaine 1 % Oint Commonly known as: NUPERCAINAL Place 1 application rectally as needed for hemorrhoids.   ferrous sulfate 325 (65 FE) MG tablet Take 1 tablet (325 mg total) by mouth 2 (two) times daily with a meal.   ibuprofen 600 MG tablet Commonly known as: ADVIL Take 1 tablet (600 mg total) by mouth every 6 (six) hours.   lidocaine 5 % Commonly known as: Lidoderm Place 1 patch onto the skin every 12 (twelve) hours. Remove & Discard patch within 12 hours or as directed by MD   measles, mumps & rubella vaccine injection Commonly known as: MMR Inject 0.5 mLs into the skin once for 1 dose.   pantoprazole 40 MG tablet Commonly known as: PROTONIX Take 40 mg by mouth daily.   prenatal multivitamin Tabs tablet Take 1 tablet by mouth daily at 12 noon.   senna-docusate 8.6-50 MG tablet Commonly known as: Senokot-S Take 2 tablets by mouth daily as needed for mild constipation.   witch hazel-glycerin pad Commonly known as: TUCKS Apply 1 application topically as needed for hemorrhoids.        Follow-up Information    McVey, Murray Hodgkins, CNM Follow up in 6 week(s).   Specialty: Obstetrics and Gynecology Why: postpartum visit.   Contact information: [address] [place] [address] [phone]               Signed:  Minda Meo, CNM 10/16/2019  9:20 AM  Drinda Butts,  CNM Certified Nurse Midwife [facility]

## 2019-10-14 NOTE — Progress Notes (Signed)
Labor Progress Note  Virginia Mitchell is a 29 y.o. [phone] at [redacted]w[redacted]d by LMP admitted for early labor with initial Cat II tracing on admission.   Subjective: requesting epidural, more painful UCs since her water broke at 0440  Objective: BP 113/64 (BP Location: Left Arm)   Pulse 99   Temp 98.7 F (37.1 C) (Oral)   Resp 18   Ht 5' (1.524 m)   Wt 86.2 kg   LMP 01/07/2019 (Approximate)   BMI 37.11 kg/m  Notable VS details: reviewed  Fetal Assessment: FHT:  FHR: 130 bpm, variability: moderate,  accelerations:  Abscent,  decelerations:  Present variable and early decels with UCs.  Category/reactivity:  Category II UC:   regular, every 2-3 minutes, Pitocin at 47mu/min SVE:   Per RN exam Dilation: 5 Effacement (%): 70 Cervical Position: Middle Station: -3 Presentation: Vertex Exam by:: Vania Rea RN  Membrane status: SROM Amniotic color: meconium  Labs: Lab Results  Component Value Date   WBC 16.7 (H) 10/13/2019   HGB 10.4 (L) 10/13/2019   HCT 30.8 (L) 10/13/2019   MCV 89.5 10/13/2019   PLT 377 10/13/2019    Assessment / Plan: Spontaneous labor, progressing normally  Labor: progressing on Pitocin Preeclampsia:  no e/o Pre-e Fetal Wellbeing:  Category II Pain Control:  Labor support without medications and anesthesia at bedside.  I/D:  GBS Pos- s/p 1st dose of Ancef Anticipated MOD:  NSVD  Prudencio Pair Keandra Medero, CNM 10/14/2019, 5:07 AM

## 2019-10-14 NOTE — H&P (Signed)
OB History & Physical   History of Present Illness:  Chief Complaint: contractions  HPI:  Virginia Mitchell is a 29 y.o. [phone] female at [redacted]w[redacted]d dated by LMP and c/w [place] at [redacted]w[redacted]d.  She presents to L&D for Painful UCs since approx 10am yesterday that worsened around 5pm last night. Currently feeling every 3-41min. Denies LOF or VB, notes active FM.    Pregnancy Issues: 1. BMI > 30  P/C, A1C and CMP drawn at NOB  ASA started at 17 week- reviewed dosing at 28wks 2. Smoker  1/2 PPD as of 05/10/19  5-6 of the "shorts"/day as of 06/28/19; no change as of 07/26/19 3. Anemia  Hgb 9.9 at 28wks- advised iron supplement 4. Impaired glucose tolerance  Elevated 1hr GTT 143;   3hr GTT: 126-152-101-100  5. Rubella Non-Immune  Advise vaccine postpartum 6. Lapse in prenatal care, last seen at 28wks   Maternal Medical History:   Past Medical History:  Diagnosis Date  . Anemia     Past Surgical History:  Procedure Laterality Date  . DILATION AND CURETTAGE OF UTERUS      Allergies  Allergen Reactions  . Latex Hives  . Sulfa Antibiotics Shortness Of Breath  . Penicillins Other (See Comments)    Reaction when little.     Prior to Admission medications   Medication Sig Start Date End Date Taking? Authorizing Provider  acetaminophen (TYLENOL) 500 MG tablet Take 500 mg by mouth every 6 (six) hours as needed.   Yes [provider]  pantoprazole (PROTONIX) 40 MG tablet Take 40 mg by mouth daily.   Yes [provider]  Prenatal Vit-Fe Fumarate-FA (PRENATAL MULTIVITAMIN) TABS tablet Take 1 tablet by mouth daily at 12 noon.   Yes [provider]  lidocaine (LIDODERM) 5 % Place 1 patch onto the skin every 12 (twelve) hours. Remove & Discard patch within 12 hours or as directed by MD 09/03/19 09/02/20  Chesley Noon, MD     Prenatal care site: [facility] (4 visits total)  Social History: She  reports that she has been smoking cigarettes. She has been  smoking about 0.50 packs per day. She has never used smokeless tobacco. She reports previous alcohol use. She reports previous drug use. Drug: Marijuana.  Family History: family history includes Asthma in her paternal grandmother; Diabetes in her paternal grandmother; Migraines in her paternal grandmother.   Review of Systems: A full review of systems was performed and negative except as noted in the HPI.     Physical Exam:  Vital Signs: BP 113/64 (BP Location: Left Arm)   Pulse 99   Temp 98.7 F (37.1 C) (Oral)   Resp 18   Ht 5' (1.524 m)   Wt 86.2 kg   LMP 01/07/2019 (Approximate)   BMI 37.11 kg/m  General: no acute distress.  HEENT: normocephalic, atraumatic Heart: regular rate & rhythm.  No murmurs/rubs/gallops Lungs: clear to auscultation bilaterally, normal respiratory effort Abdomen: soft, gravid, non-tender;  EFW: 6lbs Pelvic:   External: Normal external female genitalia  Cervix: Dilation: 3 / Effacement (%): 40 / Station: -3    Extremities: non-tender, symmetric, no edema bilaterally.  DTRs: 2+  Neurologic: Alert & oriented x 3.    Results for orders placed or performed during the hospital encounter of 10/13/19 (from the past 24 hour(s))  CBC on admission     Status: Abnormal   Collection Time: 10/13/19  8:50 PM  Result Value Ref Range   WBC 16.7 (H)  4.0 - 10.5 K/uL   RBC 3.44 (L) 3.87 - 5.11 MIL/uL   Hemoglobin 10.4 (L) 12.0 - 15.0 g/dL   HCT 30.8 (L) 36 - 46 %   MCV 89.5 80.0 - 100.0 fL   MCH 30.2 26.0 - 34.0 pg   MCHC 33.8 30.0 - 36.0 g/dL   RDW 13.9 11.5 - 15.5 %   Platelets 377 150 - 400 K/uL   nRBC 0.0 0.0 - 0.2 %  Comprehensive metabolic panel     Status: Abnormal   Collection Time: 10/13/19  8:50 PM  Result Value Ref Range   Sodium 134 (L) 135 - 145 mmol/L   Potassium 3.8 3.5 - 5.1 mmol/L   Chloride 104 98 - 111 mmol/L   CO2 22 22 - 32 mmol/L   Glucose, Bld 76 70 - 99 mg/dL   BUN 7 6 - 20 mg/dL   Creatinine, Ser 0.44 0.44 - 1.00 mg/dL   Calcium  8.9 8.9 - 10.3 mg/dL   Total Protein 6.6 6.5 - 8.1 g/dL   Albumin 3.1 (L) 3.5 - 5.0 g/dL   AST 17 15 - 41 U/L   ALT 11 0 - 44 U/L   Alkaline Phosphatase 161 (H) 38 - 126 U/L   Total Bilirubin 0.6 0.3 - 1.2 mg/dL   GFR calc non Af Amer >60 >60 mL/min   GFR calc Af Amer >60 >60 mL/min   Anion gap 8 5 - 15  Type and screen New Site     Status: None   Collection Time: 10/13/19  8:50 PM  Result Value Ref Range   ABO/RH(D) O POS    Antibody Screen NEG    Sample Expiration      10/16/2019,2359 Performed at [facility], [place],  [address]   Rapid HIV screen (HIV 1/2 Ab+Ag) ([facility] Only)     Status: None   Collection Time: 10/13/19  8:50 PM  Result Value Ref Range   HIV-1 P24 Antigen - HIV24 NON REACTIVE NON REACTIVE   HIV 1/2 Antibodies NON REACTIVE NON REACTIVE   Interpretation (HIV Ag Ab)      A non reactive test result means that HIV 1 or HIV 2 antibodies and HIV 1 p24 antigen were not detected in the specimen.  Urine Drug Screen, Qualitative ([facility] only)     Status: None   Collection Time: 10/13/19  9:16 PM  Result Value Ref Range   Tricyclic, Ur Screen NONE DETECTED NONE DETECTED   Amphetamines, Ur Screen NONE DETECTED NONE DETECTED   MDMA (Ecstasy)Ur Screen NONE DETECTED NONE DETECTED   Cocaine Metabolite,Ur [place] NONE DETECTED NONE DETECTED   Opiate, Ur Screen NONE DETECTED NONE DETECTED   Phencyclidine (PCP) Ur S NONE DETECTED NONE DETECTED   Cannabinoid 50 Ng, Ur [place] NONE DETECTED NONE DETECTED   Barbiturates, Ur Screen NONE DETECTED NONE DETECTED   Benzodiazepine, Ur Scrn NONE DETECTED NONE DETECTED   Methadone Scn, Ur NONE DETECTED NONE DETECTED  Chlamydia/NGC rt PCR ([facility] only)     Status: None   Collection Time: 10/13/19  9:17 PM   Specimen: Urine  Result Value Ref Range   Specimen source GC/Chlam VAGINA    Chlamydia Tr NOT DETECTED NOT DETECTED   N gonorrhoeae NOT DETECTED NOT DETECTED  Group B strep by PCR      Status: Abnormal   Collection Time: 10/13/19  9:17 PM   Specimen: Vaginal/Rectal; Genital  Result Value Ref Range   Group  B strep by PCR POSITIVE (A) NEGATIVE  SARS Coronavirus 2 by RT PCR (hospital order, performed in [facility] hospital lab) Nasopharyngeal Nasopharyngeal Swab     Status: None   Collection Time: 10/13/19  9:19 PM   Specimen: Nasopharyngeal Swab  Result Value Ref Range   SARS Coronavirus 2 NEGATIVE NEGATIVE    Pertinent Results:  Prenatal Labs: Blood type/Rh  O  POS  Antibody screen neg  Rubella NON-Immune  Varicella Immune  RPR NR  HBsAg Neg  HIV NR  GC neg  Chlamydia neg  Genetic screening negative  1 hour GTT  143  3 hour GTT 126-152-101-100  GBS  Pos by PCR on admission   FHT: 140 bpm, moderate variability, + accels, intermittent decels- poor tracing quality, frequent breaks in continuous tracing noted on strip review since admission.  TOCO: q55min SVE:  Dilation: 3 / Effacement (%): 40 / Station: -3    Cephalic by leopolds and confirmed with bedside US.   No results found.  Assessment:  Virginia Mitchell is a 29 y.o. [phone] female at [redacted]w[redacted]d with early labor, limited PNC.   Plan:  1. Admit to Labor & Delivery; consents reviewed and obtained - COVID neg on admit - routine 3rd tri labs ordered on admission including GC/CT, GBS, HIV.  - mild anemia noted on admission.  - PCN allergy is unknown, "reaction as a baby"; will give Ancef  2. Fetal Well being  - Fetal Tracing: Cat II- intermittent tracing noted, initially tachycardia 170bpm on arrival, pt reported smoking cigarettes just prior to arrival. Intermittent variable decels, ? Late decels but poor tracing quality. Moderate variability throughout.  - Group B Streptococcus ppx indicated: Positive. - Presentation: cephalic confirmed by Leopolds and [place] at bedside.    3. Routine OB: - Prenatal labs reviewed, as above - Rh O Pos - CBC, T&S, RPR on admit - Clear fluids, IVF  4. Augmentation of  Labor -  Contractions: external toco in place -  Pelvis proven to 9lbs -  Plan for induction with Pitocin/AROM -  Plan for continuous fetal monitoring  -  Maternal pain control as desired - Anticipate vaginal delivery   Randa Ngo, CNM 10/14/19 12:22 AM

## 2019-10-14 NOTE — Plan of Care (Signed)
Continue plan of care.

## 2019-10-14 NOTE — Anesthesia Preprocedure Evaluation (Signed)
Anesthesia Evaluation  Patient identified by MRN, date of birth, ID band Patient awake    Reviewed: Allergy & Precautions, H&P , NPO status , Patient's Chart, lab work & pertinent test results, reviewed documented beta blocker date and time   History of Anesthesia Complications Negative for: history of anesthetic complications  Airway Mallampati: III  TM Distance: >3 FB Neck ROM: full    Dental no notable dental hx.    Pulmonary neg shortness of breath, neg recent URI, Current Smoker,    Pulmonary exam normal breath sounds clear to auscultation       Cardiovascular Exercise Tolerance: Good negative cardio ROS Normal cardiovascular exam Rhythm:regular Rate:Normal     Neuro/Psych negative neurological ROS  negative psych ROS   GI/Hepatic Neg liver ROS, GERD  ,  Endo/Other  negative endocrine ROS  Renal/GU negative Renal ROS  negative genitourinary   Musculoskeletal   Abdominal   Peds  Hematology  (+) Blood dyscrasia, anemia ,   Anesthesia Other Findings Past Medical History: No date: Anemia   Reproductive/Obstetrics negative OB ROS                             Anesthesia Physical Anesthesia Plan  ASA: II  Anesthesia Plan: Epidural   Post-op Pain Management:    Induction:   PONV Risk Score and Plan:   Airway Management Planned:   Additional Equipment:   Intra-op Plan:   Post-operative Plan:   Informed Consent: I have reviewed the patients History and Physical, chart, labs and discussed the procedure including the risks, benefits and alternatives for the proposed anesthesia with the patient or authorized representative who has indicated his/her understanding and acceptance.     Dental Advisory Given  Plan Discussed with: Anesthesiologist, CRNA and Surgeon  Anesthesia Plan Comments:         Anesthesia Quick Evaluation

## 2019-10-14 NOTE — Lactation Note (Addendum)
This note was copied from a baby's chart. Lactation Consultation Note  Patient Name: Boy Bianney Rockwood Today's Date: 10/14/2019 Reason for consult: Initial assessment   Maternal Data Formula Feeding for Exclusion: No Does the patient have breastfeeding experience prior to this delivery?: Yes 1st child is 31 yrs old, breastfed x 2 yrs Feeding Feeding Type: Breast Fed Baby rooting, latched easily to breast with gentle pressure to jaw to widen mouth, encouraged skin to skin, mom has baby swaddled  LATCH Score Latch: Grasps breast easily, tongue down, lips flanged, rhythmical sucking.  Audible Swallowing: A few with stimulation  Type of Nipple: Everted at rest and after stimulation  Comfort (Breast/Nipple): Soft / non-tender  Hold (Positioning): No assistance needed to correctly position infant at breast.  LATCH Score: 9  Interventions Interventions: Breast feeding basics reviewed;Hand express;Coconut oil  Lactation Tools Discussed/Used WIC Program: Yes  Lc name and no written on white board Consult Status Consult Status: PRN    Dyann Kief 10/14/2019, 1:15 PM

## 2019-10-14 NOTE — Anesthesia Procedure Notes (Signed)
Epidural Patient location during procedure: OB Start time: 10/14/2019 5:09 AM End time: 10/14/2019 5:15 AM  Staffing Anesthesiologist: Lenard Simmer, MD Performed: anesthesiologist   Preanesthetic Checklist Completed: patient identified, IV checked, site marked, risks and benefits discussed, surgical consent, monitors and equipment checked, pre-op evaluation and timeout performed  Epidural Patient position: sitting Prep: ChloraPrep Patient monitoring: heart rate, continuous pulse ox and blood pressure Approach: midline Location: L3-L4 Injection technique: LOR saline  Needle:  Needle type: Tuohy  Needle gauge: 17 G Needle length: 9 cm and 9 Needle insertion depth: 5 cm Catheter type: closed end flexible Catheter size: 19 Gauge Catheter at skin depth: 10 cm Test dose: negative and 1.5% lidocaine with Epi 1:200 K  Assessment Sensory level: T10 Events: blood not aspirated, injection not painful, no injection resistance, no paresthesia and negative IV test  Additional Notes 1st attempt Pt. Evaluated and documentation done after procedure finished. Patient identified. Risks/Benefits/Options discussed with patient including but not limited to bleeding, infection, nerve damage, paralysis, failed block, incomplete pain control, headache, blood pressure changes, nausea, vomiting, reactions to medication both or allergic, itching and postpartum back pain. Confirmed with bedside nurse the patient's most recent platelet count. Confirmed with patient that they are not currently taking any anticoagulation, have any bleeding history or any family history of bleeding disorders. Patient expressed understanding and wished to proceed. All questions were answered. Sterile technique was used throughout the entire procedure. Please see nursing notes for vital signs. Test dose was given through epidural catheter and negative prior to continuing to dose epidural or start infusion. Warning signs of high  block given to the patient including shortness of breath, tingling/numbness in hands, complete motor block, or any concerning symptoms with instructions to call for help. Patient was given instructions on fall risk and not to get out of bed. All questions and concerns addressed with instructions to call with any issues or inadequate analgesia.   Patient tolerated the insertion well without immediate complications.Reason for block:procedure for pain

## 2019-10-14 NOTE — Progress Notes (Signed)
Labor Progress Note  Virginia Mitchell is a 29 y.o. [phone] at [redacted]w[redacted]d by LMP admitted for early labor with initial Cat II tracing on admission.   Subjective: more comfortable now after epidural.   Objective: BP 111/67   Pulse 80   Temp 97.9 F (36.6 C) (Oral)   Resp 18   Ht 5' (1.524 m)   Wt 86.2 kg   LMP 01/07/2019 (Approximate)   SpO2 100%   BMI 37.11 kg/m  Notable VS details: reviewed  Fetal Assessment: FHT:  FHR: 145 bpm, variability: moderate,  accelerations:  Abscent,  decelerations:  Present variable and early decels with UCs.  Category/reactivity:  Category II UC:   regular, every 2-3 minutes, Pitocin at 38mu/min SVE:   Dilation: 6.5 Effacement (%): 80 Cervical Position: Middle Station: -1, 0 Presentation: Vertex Exam by:: Ericka Marcellus CNM  Membrane status: SROM Amniotic color: meconium  Labs: Lab Results  Component Value Date   WBC 16.7 (H) 10/13/2019   HGB 10.4 (L) 10/13/2019   HCT 30.8 (L) 10/13/2019   MCV 89.5 10/13/2019   PLT 377 10/13/2019    Assessment / Plan: Spontaneous labor, progressing normally  Labor: progressing on Pitocin Preeclampsia:  no e/o Pre-e Fetal Wellbeing:  Category II Pain Control:  Epidural I/D:  GBS Pos- s/p 1st dose of Ancef Anticipated MOD:  NSVD  Virginia Mitchell, CNM 10/14/2019, 5:42 AM

## 2019-10-14 NOTE — Lactation Note (Signed)
This note was copied from a baby's chart. Lactation Consultation Note  Patient Name: Boy Makaela Cando [id number] Date: 10/14/2019 Reason for consult: Follow-up assessment   Maternal Data   MOm attempting latching baby to breast Feeding Feeding Type: Breast Fed Baby gaggy, no latch, encouraged skin to skin  LATCH Score Latch: Too sleepy or reluctant, no latch achieved, no sucking elicited.                 Interventions Interventions: Hand express;Adjust position  Lactation Tools Discussed/Used     Consult Status Consult Status: Follow-up Date: 10/15/19 Follow-up type: In-patient    Dyann Kief 10/14/2019, 6:01 PM

## 2019-10-15 LAB — CBC
HCT: 28.2 % — ABNORMAL LOW (ref 36.0–46.0)
Hemoglobin: 9.2 g/dL — ABNORMAL LOW (ref 12.0–15.0)
MCH: 29.9 pg (ref 26.0–34.0)
MCHC: 32.6 g/dL (ref 30.0–36.0)
MCV: 91.6 fL (ref 80.0–100.0)
Platelets: 321 10*3/uL (ref 150–400)
RBC: 3.08 MIL/uL — ABNORMAL LOW (ref 3.87–5.11)
RDW: 13.8 % (ref 11.5–15.5)
WBC: 16 10*3/uL — ABNORMAL HIGH (ref 4.0–10.5)
nRBC: 0.2 % (ref 0.0–0.2)

## 2019-10-15 NOTE — Progress Notes (Signed)
Post Partum Day 1  Subjective: up ad lib, voiding, tolerating PO and + flatus  Doing well, no concerns. Ambulating without difficulty, pain managed with PO meds, tolerating regular diet, and voiding without difficulty. Reports pain with hemorrhoids.   No fever/chills, chest pain, shortness of breath, nausea/vomiting, or leg pain. No nipple or breast pain. No headache, visual changes, or RUQ/epigastric pain.  Objective: BP 103/74 (BP Location: Left Arm)   Pulse 78   Temp 98.3 F (36.8 C) (Oral)   Resp 18   Ht 5' (1.524 m)   Wt 86.2 kg   LMP 01/07/2019 (Approximate)   SpO2 98%   Breastfeeding Unknown   BMI 37.11 kg/m    Physical Exam:  General: alert, cooperative and no distress Breasts: soft/nontender CV: RRR Pulm: nl effort, CTABL Abdomen: soft, non-tender, active bowel sounds Uterine Fundus: firm Perineum: minimal edema, intact Lochia: appropriate DVT Evaluation: No evidence of DVT seen on physical exam.  Recent Labs    10/13/19 2050 10/15/19 0613  HGB 10.4* 9.2*  HCT 30.8* 28.2*  WBC 16.7* 16.0*  PLT 377 321    Assessment/Plan: 29 y.o. [id number] postpartum day # 1  -Continue routine postpartum care -Lactation consult PRN for breastfeeding  -May use tucks or hemorrhoid ointment for hemorrhoid pain -Discussed contraceptive options including implant, IUDs hormonal and non-hormonal, injection, pills/ring/patch, condoms, and NFP.  -Acute blood loss anemia - hemodynamically stable and asymptomatic; start PO ferrous sulfate BID with stool softeners  -Immunization status: Needs MMR  Disposition: Continue inpatient postpartum care.  Infant requires to be monitored for 48 hours d/t GBS pos.  Plan for discharge home tomorrow.     LOS: 2 days   Minda Meo, Mallory Shirk 10/15/2019, 10:06 AM   ----- Drinda Butts  Certified Nurse Midwife [facility]

## 2019-10-15 NOTE — Anesthesia Postprocedure Evaluation (Signed)
Anesthesia Post Note  Patient: Virginia Mitchell  Procedure(s) Performed: AN AD HOC LABOR EPIDURAL  Patient location during evaluation: Mother Baby Anesthesia Type: Epidural Level of consciousness: awake and alert Pain management: pain level controlled Vital Signs Assessment: post-procedure vital signs reviewed and stable Respiratory status: spontaneous breathing, nonlabored ventilation and respiratory function stable Cardiovascular status: stable Postop Assessment: no headache, no backache and epidural receding Anesthetic complications: no   No complications documented.   Last Vitals:  Vitals:   10/14/19 2331 10/15/19 0736  BP: 133/72 103/74  Pulse: (!) 104 78  Resp: 18 18  Temp: 37 C 36.8 C  SpO2: 100% 98%    Last Pain:  Vitals:   10/15/19 0736  TempSrc: Oral  PainSc:                  Yevette Edwards

## 2019-10-15 NOTE — Plan of Care (Signed)
Alert and oriented with pleasant affect. Denies c/o. Assessment and VS wnl. Was doing Skin to skin with Infant when Nurse entered room. Appears comfortable and in NAD.

## 2019-10-16 MED ORDER — MEASLES, MUMPS & RUBELLA VAC IJ SOLR: 0.5000 mL | Freq: Once | INTRAMUSCULAR | 0 refills | Status: AC

## 2019-10-16 MED ORDER — DIBUCAINE (PERIANAL) 1 % EX OINT: 1.0000 "application " | TOPICAL_OINTMENT | CUTANEOUS | Status: DC | PRN

## 2019-10-16 MED ORDER — IBUPROFEN 600 MG PO TABS: 600.0000 mg | ORAL_TABLET | Freq: Four times a day (QID) | ORAL | 3 refills | Status: DC

## 2019-10-16 MED ORDER — SENNOSIDES-DOCUSATE SODIUM 8.6-50 MG PO TABS: 2.0000 | ORAL_TABLET | Freq: Every day | ORAL | Status: DC | PRN

## 2019-10-16 MED ORDER — FERROUS SULFATE 325 (65 FE) MG PO TABS: 325.0000 mg | ORAL_TABLET | Freq: Two times a day (BID) | ORAL | 3 refills | Status: DC

## 2019-10-16 MED ORDER — WITCH HAZEL-GLYCERIN EX PADS: 1.0000 | MEDICATED_PAD | CUTANEOUS | 12 refills | Status: DC | PRN

## 2019-10-16 NOTE — Progress Notes (Signed)
Discharge instructions complete and prescriptions sent to the pharmacy by the provider. Patient verbalizes understanding of teaching. Patient discharged home via wheelchair at 1255.

## 2019-10-16 NOTE — Discharge Instructions (Signed)
Vaginal Delivery, Care After Refer to this sheet in the next few weeks. These discharge instructions provide you with information on caring for yourself after delivery. Your caregiver may also give you specific instructions. Your treatment has been planned according to the most current medical practices available, but problems sometimes occur. Call your caregiver if you have any problems or questions after you go home. HOME CARE INSTRUCTIONS 1. Take over-the-counter or prescription medicines only as directed by your caregiver or pharmacist. 2. Do not drink alcohol, especially if you are breastfeeding or taking medicine to relieve pain. 3. Do not smoke tobacco. 4. Continue to use good perineal care. Good perineal care includes: 1. Wiping your perineum from back to front 2. Keeping your perineum clean. 3. You can do sitz baths twice a day, to help keep this area clean 5. Do not use tampons, douche or have sex until your caregiver says it is okay. 6. Shower only and avoid sitting in submerged water, aside from sitz baths 7. Wear a well-fitting bra that provides breast support. 8. Eat healthy foods. 9. Drink enough fluids to keep your urine clear or pale yellow. 10. Eat high-fiber foods such as whole grain cereals and breads, brown rice, beans, and fresh fruits and vegetables every day. These foods may help prevent or relieve constipation. 11. Avoid constipation with high fiber foods or medications, such as miralax or metamucil 12. Follow your caregiver's recommendations regarding resumption of activities such as climbing stairs, driving, lifting, exercising, or traveling. 13. Talk to your caregiver about resuming sexual activities. Resumption of sexual activities is dependent upon your risk of infection, your rate of healing, and your comfort and desire to resume sexual activity. 14. Try to have someone help you with your household activities and your newborn for at least a few days after you leave  the hospital. 15. Rest as much as possible. Try to rest or take a nap when your newborn is sleeping. 16. Increase your activities gradually. 17. Keep all of your scheduled postpartum appointments. It is very important to keep your scheduled follow-up appointments. At these appointments, your caregiver will be checking to make sure that you are healing physically and emotionally. SEEK MEDICAL CARE IF:   You are passing large clots from your vagina. Save any clots to show your caregiver.  You have a foul smelling discharge from your vagina.  You have trouble urinating.  You are urinating frequently.  You have pain when you urinate.  You have a change in your bowel movements.  You have increasing redness, pain, or swelling near your vaginal incision (episiotomy) or vaginal tear.  You have pus draining from your episiotomy or vaginal tear.  Your episiotomy or vaginal tear is separating.  You have painful, hard, or reddened breasts.  You have a severe headache.  You have blurred vision or see spots.  You feel sad or depressed.  You have thoughts of hurting yourself or your newborn.  You have questions about your care, the care of your newborn, or medicines.  You are dizzy or light-headed.  You have a rash.  You have nausea or vomiting.  You were breastfeeding and have not had a menstrual period within 12 weeks after you stopped breastfeeding.  You are not breastfeeding and have not had a menstrual period by the 12th week after delivery.  You have a fever. SEEK IMMEDIATE MEDICAL CARE IF:   You have persistent pain.  You have chest pain.  You have shortness of breath.    You faint.  You have leg pain.  You have stomach pain.  Your vaginal bleeding saturates two or more sanitary pads in 1 hour. MAKE SURE YOU:   Understand these instructions.  Will watch your condition.  Will get help right away if you are not doing well or get worse. Document Released:  04/18/2000 Document Revised: 09/05/2013 Document Reviewed: 12/17/2011 ExitCare Patient Information 2015 [employer]. This information is not intended to replace advice given to you by your health care provider. Make sure you discuss any questions you have with your health care provider.  Sitz Bath A sitz bath is a warm water bath taken in the sitting position. The water covers only the hips and butt (buttocks). We recommend using one that fits in the toilet, to help with ease of use and cleanliness. It may be used for either healing or cleaning purposes. Sitz baths are also used to relieve pain, itching, or muscle tightening (spasms). The water may contain medicine. Moist heat will help you heal and relax.  HOME CARE  Take 3 to 4 sitz baths a day. 18. Fill the bathtub half-full with warm water. 19. Sit in the water and open the drain a little. 20. Turn on the warm water to keep the tub half-full. Keep the water running constantly. 21. Soak in the water for 15 to 20 minutes. 22. After the sitz bath, pat the affected area dry. GET HELP RIGHT AWAY IF: You get worse instead of better. Stop the sitz baths if you get worse. MAKE SURE YOU:  Understand these instructions.  Will watch your condition.  Will get help right away if you are not doing well or get worse. Document Released: 05/29/2004 Document Revised: 01/14/2012 Document Reviewed: 08/19/2010 ExitCare Patient Information 2015 [employer]. This information is not intended to replace advice given to you by your health care provider. Make sure you discuss any questions you have with your health care provider.    

## 2019-10-16 NOTE — Clinical Social Work Maternal (Signed)
  CLINICAL SOCIAL WORK MATERNAL/CHILD NOTE  Patient Details  Name: Virginia Mitchell MRN: [id number] Date of Birth: 03/24/1991  Date:  10/16/2019  Clinical Social Worker Initiating Note:  The patient reported that she began prenatal treatment when she was 3 months pregnant.  She reported that her last appointment was 3-4 months ago. Patient has a Date/Time: Initiated:  10/16/19/      Child's Name:  Truett Perna   Biological Parents:  Mother, Father Resa Miner, father)   Need for Interpreter:  None   Reason for Referral:  Late or No Prenatal Care    Address:  [address] [place] [address]    Phone number:  [phone] (home)     Additional phone number: [phone].  Household Members/Support Persons (HM/SP):   Household Member/Support Person 1   HM/SP Name Relationship DOB or Age  HM/SP -1 Resa Miner Dad Kamrin 7 son    HM/SP -2        HM/SP -3        HM/SP -4        HM/SP -5        HM/SP -6        HM/SP -7        HM/SP -8          Natural Supports (not living in the home):  Children, Garment/textile technologist, Friends, Counselling psychologist, Social worker, Spouse/significant other   Professional Supports:     Employment: Full-time   Type of Work: Patient reported that she was laid off work shortly after finding out that she was pregnant.   Education:  High school graduate   Homebound arranged:    Financial Resources:  Medicaid   Other Resources:      Cultural/Religious Considerations Which May Impact Care:  none   Strengths:  Ability to meet basic needs , Home prepared for child    Psychotropic Medications:       None  Pediatrician:     [facility].   Pediatrician List:   [facility] [place]    [facility]      Pediatrician Fax Number: [facility]  Risk Factors/Current Problems:  None   Cognitive State:  Alert     Mood/Affect:  Happy    CSW Assessment: Patient has sufficient support and home is safe for her newborn.   CSW Plan/Description:  No Further Intervention Required/No Barriers to Discharge    Laquandra Carrillo I Marnisha Stampley, LCSW 10/16/2019, 11:50 AM

## 2019-11-03 DIAGNOSIS — Z419 Encounter for procedure for purposes other than remedying health state, unspecified: Secondary | ICD-10-CM | POA: Diagnosis not present

## 2019-12-04 DIAGNOSIS — Z419 Encounter for procedure for purposes other than remedying health state, unspecified: Secondary | ICD-10-CM | POA: Diagnosis not present

## 2020-01-04 DIAGNOSIS — Z419 Encounter for procedure for purposes other than remedying health state, unspecified: Secondary | ICD-10-CM | POA: Diagnosis not present

## 2020-02-03 DIAGNOSIS — Z419 Encounter for procedure for purposes other than remedying health state, unspecified: Secondary | ICD-10-CM | POA: Diagnosis not present

## 2020-03-04 ENCOUNTER — Emergency Department: Payer: Medicaid Other

## 2020-03-04 ENCOUNTER — Emergency Department
Admission: EM | Admit: 2020-03-04 | Discharge: 2020-03-04 | Disposition: A | Discharge status: Home / Self Care | Payer: Medicaid Other | Attending: Emergency Medicine | Admitting: Emergency Medicine

## 2020-03-04 ENCOUNTER — Encounter: Payer: Self-pay | Admitting: Emergency Medicine

## 2020-03-04 ENCOUNTER — Other Ambulatory Visit: Payer: Self-pay

## 2020-03-04 DIAGNOSIS — F17210 Nicotine dependence, cigarettes, uncomplicated: Secondary | ICD-10-CM | POA: Insufficient documentation

## 2020-03-04 DIAGNOSIS — Z5321 Procedure and treatment not carried out due to patient leaving prior to being seen by health care provider: Secondary | ICD-10-CM | POA: Insufficient documentation

## 2020-03-04 DIAGNOSIS — N611 Abscess of the breast and nipple: Secondary | ICD-10-CM | POA: Insufficient documentation

## 2020-03-04 DIAGNOSIS — N644 Mastodynia: Secondary | ICD-10-CM | POA: Insufficient documentation

## 2020-03-04 DIAGNOSIS — Z91040 Latex allergy status: Secondary | ICD-10-CM | POA: Insufficient documentation

## 2020-03-04 MED ORDER — CLINDAMYCIN HCL 300 MG PO CAPS: 300.0000 mg | ORAL_CAPSULE | Freq: Four times a day (QID) | ORAL | 0 refills | Status: AC

## 2020-03-04 NOTE — ED Provider Notes (Signed)
[facility] Emergency Department Provider Note  ____________________________________________  Time seen: Approximately 1:57 PM  I have reviewed the triage vital signs and the nursing notes.   HISTORY  Chief Complaint Abscess    HPI Virginia Mitchell is a 29 y.o. female that presents to the emergency department for evaluation of mass to right breast for 1 day.  Mass is located behind patient's right nipple and areola.  She noticed it yesterday.  She is breast-feeding.  She is not really noticing any drainage but does have some crusting to her nipple.  No fevers.  Past Medical History:  Diagnosis Date  . Anemia     Patient Active Problem List   Diagnosis Date Noted  . Limited prenatal care in third trimester 10/13/2019  . Pregnancy 09/03/2019  . Abdominal pain affecting pregnancy 08/08/2019    Past Surgical History:  Procedure Laterality Date  . DILATION AND CURETTAGE OF UTERUS      Prior to Admission medications   Medication Sig Start Date End Date Taking? Authorizing Provider  acetaminophen (TYLENOL) 500 MG tablet Take 500 mg by mouth every 6 (six) hours as needed.    [provider]  clindamycin (CLEOCIN) 300 MG capsule Take 1 capsule (300 mg total) by mouth 4 (four) times daily for 7 days. 03/04/20 03/11/20  Enid Derry, PA-C  dibucaine (NUPERCAINAL) 1 % OINT Place 1 application rectally as needed for hemorrhoids. 10/16/19   Gustavo Lah, CNM  ferrous sulfate 325 (65 FE) MG tablet Take 1 tablet (325 mg total) by mouth 2 (two) times daily with a meal. 10/16/19   Gustavo Lah, CNM  ibuprofen (ADVIL) 600 MG tablet Take 1 tablet (600 mg total) by mouth every 6 (six) hours. 10/16/19   Gustavo Lah, CNM  lidocaine (LIDODERM) 5 % Place 1 patch onto the skin every 12 (twelve) hours. Remove & Discard patch within 12 hours or as directed by MD 09/03/19 09/02/20  Chesley Noon, MD  pantoprazole (PROTONIX) 40 MG tablet Take 40 mg by mouth daily.     [provider]  Prenatal Vit-Fe Fumarate-FA (PRENATAL MULTIVITAMIN) TABS tablet Take 1 tablet by mouth daily at 12 noon.    [provider]  senna-docusate (SENOKOT-S) 8.6-50 MG tablet Take 2 tablets by mouth daily as needed for mild constipation. 10/16/19   Gustavo Lah, CNM  witch hazel-glycerin (TUCKS) pad Apply 1 application topically as needed for hemorrhoids. 10/16/19   Gustavo Lah, CNM    Allergies Latex, Sulfa antibiotics, and Penicillins  Family History  Problem Relation Age of Onset  . Diabetes Paternal Grandmother   . Asthma Paternal Grandmother   . Migraines Paternal Grandmother   . Breast cancer Neg Hx   . Ovarian cancer Neg Hx   . Colon cancer Neg Hx     Social History Social History   Tobacco Use  . Smoking status: Current Every Day Smoker    Packs/day: 0.50    Types: Cigarettes  . Smokeless tobacco: Never Used  Vaping Use  . Vaping Use: Never used  Substance Use Topics  . Alcohol use: Not Currently  . Drug use: Not Currently    Types: Marijuana     Review of Systems  Cardiovascular: No chest pain. Respiratory:  No SOB. Gastrointestinal: No abdominal pain.  No nausea, no vomiting.  Musculoskeletal: Negative for musculoskeletal pain. Skin: Negative for rash, abrasions, lacerations, ecchymosis. Neurological: Negative for headaches   ____________________________________________   PHYSICAL EXAM:  VITAL SIGNS:  ED Triage Vitals  Enc Vitals Group     BP 03/04/20 1304 125/83     Pulse Rate 03/04/20 1304 94     Resp 03/04/20 1304 16     Temp 03/04/20 1304 98.7 F (37.1 C)     Temp Source 03/04/20 1304 Oral     SpO2 03/04/20 1304 100 %     Weight 03/04/20 1304 165 lb (74.8 kg)     Height 03/04/20 1304 5\' 4"  (1.626 m)     Head Circumference --      Peak Flow --      Pain Score 03/04/20 1307 3     Pain Loc --      Pain Edu? --      Excl. in GC? --      Constitutional: Alert and oriented. Well appearing and in no acute  distress. Eyes: Conjunctivae are normal. PERRL. EOMI. Head: Atraumatic. ENT:      Ears:      Nose: No congestion/rhinnorhea.      Mouth/Throat: Mucous membranes are moist.  Neck: No stridor.  Cardiovascular: Normal rate, regular rhythm.  Good peripheral circulation. Respiratory: Normal respiratory effort without tachypnea or retractions. Lungs CTAB. Good air entry to the bases with no decreased or absent breath sounds. Breast: 1 inch x 1 inch mass felt behind right nipple.  Some crusting to right nipple. Musculoskeletal: Full range of motion to all extremities. No gross deformities appreciated. Neurologic:  Normal speech and language. No gross focal neurologic deficits are appreciated.  Skin:  Skin is warm, dry and intact. No rash noted. Psychiatric: Mood and affect are normal. Speech and behavior are normal. Patient exhibits appropriate insight and judgement.   ____________________________________________   LABS (all labs ordered are listed, but only abnormal results are displayed)  Labs Reviewed - No data to display ____________________________________________  EKG   ____________________________________________  RADIOLOGY   03/06/20 BREAST COMPLETE UNI RIGHT INC AXILLA  Result Date: 03/04/2020 CLINICAL DATA:  Right breast abscess. EXAM: ULTRASOUND OF THE RIGHT BREAST COMPARISON:  None. FINDINGS: Static images from targeted grayscale and limited Doppler ultrasound of the right retroareolar breast demonstrate a complex fluid collection in the retroareolar 6 o'clock right breast measuring 1.5 x 1.3 x 1.3 cm. There is adjacent breast parenchyma hyperemia. IMPRESSION: Probable right breast retroareolar 6 o'clock abscess. RECOMMENDATION: Recommend starting treatment with empiric antibiotics. If the patient does not present with systemic symptoms, recommend for her to be seen for a follow-up at the [facility], at their first available opening during the week, in planning for  potential abscess aspiration. I have discussed the findings and recommendations with the patient. If applicable, a reminder letter will be sent to the patient regarding the next appointment. BI-RADS CATEGORY  3: Probably benign. Electronically Signed   By: [facility] M.D.   On: 03/04/2020 15:51    ____________________________________________    PROCEDURES  Procedure(s) performed:    Procedures    Medications - No data to display   ____________________________________________   INITIAL IMPRESSION / ASSESSMENT AND PLAN / ED COURSE  Pertinent labs & imaging results that were available during my care of the patient were reviewed by me and considered in my medical decision making (see chart for details).  Review of the [place] CSRS was performed in accordance of the [facility] prior to dispensing any controlled drugs.     Patient's diagnosis is consistent with breast abscess.  Vital signs and exam are reassuring.  Ultrasound consistent with a breast  abscess.  Radiology recommends outpatient therapy with antibiotic if no systemic symptoms and follow-up with [facility] [facility] for potential abscess aspiration.  Patient states that after she was told during ultrasound that she may need to have abscess aspirated so she began squeezing area to try to encourage abscess to drain on its own.  Patient states that nipple has been draining since and she can feel the mass getting smaller.  On my reexamination, abscess is decreasing in size.  She also has white and yellow drainage from her nipple.  She denies any systemic symptoms.  She would like to proceed with oral outpatient antibiotics and follow-up with [facility] this week.  Dr. Juliette Alcide was consulted and is in agreement with this plan for oral antibiotics and outpatient follow-up with the breast center.  Dr. Darnelle Catalan also recommends that patient use the breast pump to continue drainage.  Patient will also use warm compresses.  She  will call the breast center tomorrow.  Patient will be discharged home with prescriptions for clindamycin.  Patient will formula feed her child until her follow-up with the breast center.  Patient is given ED precautions to return to the ED for any worsening or new symptoms.  Virginia Mitchell was evaluated in Emergency Department on 03/04/2020 for the symptoms described in the history of present illness. She was evaluated in the context of the global COVID-19 pandemic, which necessitated consideration that the patient might be at risk for infection with the SARS-CoV-2 virus that causes COVID-19. Institutional protocols and algorithms that pertain to the evaluation of patients at risk for COVID-19 are in a state of rapid change based on information released by regulatory bodies including the CDC and federal and state organizations. These policies and algorithms were followed during the patient's care in the ED.   ____________________________________________  FINAL CLINICAL IMPRESSION(S) / ED DIAGNOSES  Final diagnoses:  Breast abscess      NEW MEDICATIONS STARTED DURING THIS VISIT:  ED Discharge Orders         Ordered    clindamycin (CLEOCIN) 300 MG capsule  4 times daily        03/04/20 1701              This chart was dictated using voice recognition software/Dragon. Despite best efforts to proofread, errors can occur which can change the meaning. Any change was purely unintentional.    Enid Derry, PA-C 03/04/20 1759    Arnaldo Natal, MD 03/05/20 [phone]

## 2020-03-04 NOTE — Discharge Instructions (Signed)
You have an abscess to your right breast.  Please use your breast pump and continue using warm compresses.  Please begin antibiotics.  Formula feed baby if possible.  Please call the normal breast center tomorrow morning for a follow-up appointment as soon as possible this week.

## 2020-03-04 NOTE — ED Notes (Signed)
First Nurse Note: Pt to ED via POV c/o lump in her right breast. Pt states that she came last night but LWBS.

## 2020-03-04 NOTE — ED Notes (Signed)
Rest rooms and outside checked for patient.  Unable to locate patient.

## 2020-03-04 NOTE — ED Triage Notes (Signed)
Possible breast abscess. Has been breastfeeding. Happened a few months ago but came back in the past 5 days. Sore, some oozing, and a lump behind her nipple. Pt had nipple rings before baby.

## 2020-03-04 NOTE — ED Notes (Signed)
Tech in to triage 1 to draw blood, unable to locate patient.

## 2020-03-04 NOTE — ED Triage Notes (Signed)
Pt arrived via POV with reports of right breast pain, pt states she is breast feeding her son currently and reports she felt a lump under nipple.  Pt reports she has seen pus draining from the R side and has stopped breastfeeding from the R side.  Pt reports anything touching her nipple causes pain.

## 2020-03-04 NOTE — ED Notes (Signed)
Patient has still not returned, unable to locate patient in waiting room, rest rooms or outside.

## 2020-03-05 DIAGNOSIS — Z419 Encounter for procedure for purposes other than remedying health state, unspecified: Secondary | ICD-10-CM | POA: Diagnosis not present

## 2020-03-07 ENCOUNTER — Other Ambulatory Visit: Payer: Self-pay | Admitting: Nurse Practitioner

## 2020-03-07 ENCOUNTER — Ambulatory Visit (INDEPENDENT_AMBULATORY_CARE_PROVIDER_SITE_OTHER): Payer: Medicaid Other | Admitting: Nurse Practitioner

## 2020-03-07 ENCOUNTER — Telehealth: Payer: Self-pay | Admitting: *Deleted

## 2020-03-07 ENCOUNTER — Other Ambulatory Visit: Payer: Self-pay | Admitting: Internal Medicine

## 2020-03-07 VITALS — BP 110/78 | HR 84 | Temp 97.1°F | Wt 173.0 lb

## 2020-03-07 DIAGNOSIS — N611 Abscess of the breast and nipple: Secondary | ICD-10-CM | POA: Diagnosis not present

## 2020-03-07 NOTE — Assessment & Plan Note (Signed)
Mastitis Right Breast:  Continue warm wet compresses three times daily  Wash with dial antibacterial soap  Please call [facility] to set up appointment to establish care with PCP -    [phone]  Will place a referral to follow up with [facility]   Follow up:  Follow up as needed

## 2020-03-07 NOTE — Progress Notes (Signed)
@Patient  ID: , female    DOB: Dec 27, 1990, 29 y.o.   MRN: [id number]  Chief Complaint  Patient presents with  . New Patient (Initial Visit)    ED follow up, right breast abcess, is getting smaller applys warm compresses. Has not start Clindimycin due to breastfeeding.     Referring provider: No ref. provider found   29 year old female with no significant health history.  HPI Patient presents today for transition of care visit.  She was seen in the ED for right breast abscess.  Patient was prescribed clindamycin.  She has not taken the medication due to breast-feeding.  She has been doing warm wet compresses to the abscess.  It has drained and is much better at this point.  Patient was advised to call [facility] to make an appointment for follow-up but she failed to do so.  We will place a referral to them today.  Patient does need primary care physician will give her the contact number for primary care through [facility] health in the area where she lives close to [place].  Overall patient has been doing well.  She is no longer having pain.  She denies any recent fever. Denies f/c/s, n/v/d, hemoptysis, PND.      Allergies  Allergen Reactions  . Latex Hives  . Sulfa Antibiotics Shortness Of Breath  . Penicillins Other (See Comments)    Reaction when little.     There is no immunization history for the selected administration types on file for this patient.  Past Medical History:  Diagnosis Date  . Anemia     Tobacco History: Social History   Tobacco Use  Smoking Status Current Every Day Smoker  . Packs/day: 0.50  . Types: Cigarettes  Smokeless Tobacco Never Used   Ready to quit: No Counseling given: Yes   Outpatient Encounter Medications as of 03/07/2020  Medication Sig  . acetaminophen (TYLENOL) 500 MG tablet Take 500 mg by mouth every 6 (six) hours as needed. (Patient not taking: Reported on 03/07/2020)  . clindamycin (CLEOCIN) 300 MG capsule  Take 1 capsule (300 mg total) by mouth 4 (four) times daily for 7 days. (Patient not taking: Reported on 03/07/2020)  . dibucaine (NUPERCAINAL) 1 % OINT Place 1 application rectally as needed for hemorrhoids. (Patient not taking: Reported on 03/07/2020)  . ferrous sulfate 325 (65 FE) MG tablet Take 1 tablet (325 mg total) by mouth 2 (two) times daily with a meal. (Patient not taking: Reported on 03/07/2020)  . ibuprofen (ADVIL) 600 MG tablet Take 1 tablet (600 mg total) by mouth every 6 (six) hours. (Patient not taking: Reported on 03/07/2020)  . lidocaine (LIDODERM) 5 % Place 1 patch onto the skin every 12 (twelve) hours. Remove & Discard patch within 12 hours or as directed by MD (Patient not taking: Reported on 03/07/2020)  . pantoprazole (PROTONIX) 40 MG tablet Take 40 mg by mouth daily. (Patient not taking: Reported on 03/07/2020)  . Prenatal Vit-Fe Fumarate-FA (PRENATAL MULTIVITAMIN) TABS tablet Take 1 tablet by mouth daily at 12 noon. (Patient not taking: Reported on 03/07/2020)  . senna-docusate (SENOKOT-S) 8.6-50 MG tablet Take 2 tablets by mouth daily as needed for mild constipation. (Patient not taking: Reported on 03/07/2020)  . witch hazel-glycerin (TUCKS) pad Apply 1 application topically as needed for hemorrhoids. (Patient not taking: Reported on 03/07/2020)   No facility-administered encounter medications on file as of 03/07/2020.     Review of Systems  Review of Systems  Constitutional:  Negative.  Negative for fatigue and fever.  HENT: Negative.   Cardiovascular: Negative.   Gastrointestinal: Negative.   Allergic/Immunologic: Negative.   Neurological: Negative.   Psychiatric/Behavioral: Negative.        Physical Exam  BP 110/78 (BP Location: Left Arm)   Pulse 84   Temp (!) 97.1 F (36.2 C)   Wt 173 lb (78.5 kg)   LMP 03/04/2020   SpO2 97%   Breastfeeding Yes   BMI 29.70 kg/m   Wt Readings from Last 5 Encounters:  03/07/20 173 lb (78.5 kg)  03/04/20 165 lb (74.8 kg)    03/04/20 165 lb (74.8 kg)  10/13/19 190 lb (86.2 kg)  09/03/19 182 lb 15.7 oz (83 kg)     Physical Exam Vitals and nursing note reviewed.  Constitutional:      General: She is not in acute distress.    Appearance: She is well-developed.  Cardiovascular:     Rate and Rhythm: Normal rate and regular rhythm.  Pulmonary:     Effort: Pulmonary effort is normal.     Breath sounds: Normal breath sounds.  Chest:       Comments: Small mass noted under right nipple - non-tender, mobile, no redness or drainage noted.  Neurological:     Mental Status: She is alert and oriented to person, place, and time.       Imaging: US BREAST COMPLETE UNI RIGHT INC AXILLA  Result Date: 03/04/2020 CLINICAL DATA:  Right breast abscess. EXAM: ULTRASOUND OF THE RIGHT BREAST COMPARISON:  None. FINDINGS: Static images from targeted grayscale and limited Doppler ultrasound of the right retroareolar breast demonstrate a complex fluid collection in the retroareolar 6 o'clock right breast measuring 1.5 x 1.3 x 1.3 cm. There is adjacent breast parenchyma hyperemia. IMPRESSION: Probable right breast retroareolar 6 o'clock abscess. RECOMMENDATION: Recommend starting treatment with empiric antibiotics. If the patient does not present with systemic symptoms, recommend for her to be seen for a follow-up at the [facility], at their first available opening during the week, in planning for potential abscess aspiration. I have discussed the findings and recommendations with the patient. If applicable, a reminder letter will be sent to the patient regarding the next appointment. BI-RADS CATEGORY  3: Probably benign. Electronically Signed   By: Ted Mcalpine M.D.   On: 03/04/2020 15:51     Assessment & Plan:   Breast abscess Mastitis Right Breast:  Continue warm wet compresses three times daily  Wash with dial antibacterial soap  Please call [facility] to set up appointment to  establish care with PCP -    [phone]  Will place a referral to follow up with [facility]   Follow up:  Follow up as needed     Ivonne Andrew, NP 03/07/2020

## 2020-03-07 NOTE — Telephone Encounter (Signed)
CALLED PT PER REQUEST FROM JANET IN THE [facility] DEPT [place] TO SCHD PT FOR BR ASP RT BR ABSCESS. PT STATES THAT AT THIS TIME THE AREA SEEMS TO BE BETTER AND DOES NOT WANT TO SCHD THE ASP. SHE STATES IF IT DOES NOT COMPLETELY RESOLVE SHE WCB TO SCHD BR ASP

## 2020-03-07 NOTE — Patient Instructions (Signed)
  Mastitis Right Breast:  Continue warm wet compresses three times daily  Wash with dial antibacterial soap  Please call [facility] to set up appointment to establish care with PCP -    [phone]  Will place a referral to follow up with [facility] clinic   Follow up:  Follow up as needed     Mastitis  Mastitis is irritation and swelling (inflammation) in an area of the breast. It is often caused by an infection that occurs when germs (bacteria) enter the skin. This most often happens to breastfeeding mothers, but it can happen to other women too as well as some men. Follow these instructions at home: Medicines  Take over-the-counter and prescription medicines only as told by your doctor.  If you were prescribed an antibiotic medicine, take it as told by your doctor. Do not stop taking it even if you start to feel better. General instructions  Do not wear a tight or underwire bra. Wear a soft support bra.  Drink more fluids, especially if you have a fever.  Get plenty of rest. If you are breastfeeding:   Keep emptying your breasts by breastfeeding or by using a breast pump.  Keep your nipples clean and dry.  During breastfeeding, empty the first breast before going to the other breast. Use a breast pump if your baby is not emptying your breasts.  Massage your breasts during feeding or pumping as told by your doctor.  If told, put moist heat on the affected area of your breast right before breastfeeding or pumping. Use the heat source that your doctor tells you to use.  If told, put ice on the affected area of your breast right after breastfeeding or pumping: ? Put ice in a plastic bag. ? Place a towel between your skin and the bag. ? Leave the ice on for 20 minutes.  If you go back to work, pump your breasts while at work.  Avoid letting your breasts get overly filled with milk (engorged). Contact a doctor if:  You have pus-like fluid  leaking from your breast.  You have a fever.  Your symptoms do not get better within 2 days. Get help right away if:  Your pain and swelling are getting worse.  Your pain is not helped by medicine.  You have a red line going from your breast toward your armpit. Summary  Mastitis is irritation and swelling in an area of the breast.  If you were prescribed an antibiotic medicine, do not stop taking it even if you start to feel better.  Drink more fluids and get plenty of rest.  Contact a doctor if your symptoms do not get better within 2 days. This information is not intended to replace advice given to you by your health care provider. Make sure you discuss any questions you have with your health care provider. Document Revised: 04/03/2017 Document Reviewed: 05/13/2016 Elsevier Patient Education  2020 [employer].

## 2020-04-04 DIAGNOSIS — Z419 Encounter for procedure for purposes other than remedying health state, unspecified: Secondary | ICD-10-CM | POA: Diagnosis not present

## 2020-05-05 DIAGNOSIS — Z419 Encounter for procedure for purposes other than remedying health state, unspecified: Secondary | ICD-10-CM | POA: Diagnosis not present

## 2020-05-05 NOTE — L&D Delivery Note (Signed)
Delivery Note At 7:07 AM a viable and healthy female "Damario" was delivered via Vaginal, Spontaneous (Presentation:  LOA).  APGAR: 8, 9; weight 8 lb 6 oz (3800 g).   Placenta status: Spontaneous, Intact.  Cord: 3 vessels with the following complications: None.  Anesthesia: Epidural Episiotomy: None Lacerations: None Suture Repair:  n/a Est. Blood Loss (mL):  250  Mom to postpartum.  Baby to Couplet care / Skin to Skin.  30yo [id number] at [redacted]w[redacted]d presenting with unremitting headache and LE swelling. PreE workup was negative, but two prolonged fetal decels in triage prompted IOL. She progressed on pitocin to fully dilated and with AROM for clear fluid pushed easily over an intact perineum and delivered a vigorous viable female infant, who was placed on the maternal abdomen. Placenta delivered spontaneously intact with a 3VC. Bleeding minimal. No perineal lacerations. Firm fundus. Skin to skin.  Christeen Douglas 01/25/2021, 7:31 AM

## 2020-06-05 DIAGNOSIS — Z419 Encounter for procedure for purposes other than remedying health state, unspecified: Secondary | ICD-10-CM | POA: Diagnosis not present

## 2020-07-03 DIAGNOSIS — Z419 Encounter for procedure for purposes other than remedying health state, unspecified: Secondary | ICD-10-CM | POA: Diagnosis not present

## 2020-07-10 ENCOUNTER — Other Ambulatory Visit: Payer: Self-pay

## 2020-07-10 ENCOUNTER — Emergency Department
Admission: EM | Admit: 2020-07-10 | Discharge: 2020-07-10 | Disposition: A | Discharge status: Home / Self Care | Payer: Medicaid Other | Attending: Emergency Medicine | Admitting: Emergency Medicine

## 2020-07-10 DIAGNOSIS — O26899 Other specified pregnancy related conditions, unspecified trimester: Secondary | ICD-10-CM | POA: Insufficient documentation

## 2020-07-10 DIAGNOSIS — Z5321 Procedure and treatment not carried out due to patient leaving prior to being seen by health care provider: Secondary | ICD-10-CM | POA: Diagnosis not present

## 2020-07-10 DIAGNOSIS — R1030 Lower abdominal pain, unspecified: Secondary | ICD-10-CM | POA: Insufficient documentation

## 2020-07-10 DIAGNOSIS — O219 Vomiting of pregnancy, unspecified: Secondary | ICD-10-CM | POA: Insufficient documentation

## 2020-07-10 DIAGNOSIS — Z3A00 Weeks of gestation of pregnancy not specified: Secondary | ICD-10-CM | POA: Diagnosis not present

## 2020-07-10 LAB — COMPREHENSIVE METABOLIC PANEL
ALT: 16 U/L (ref 0–44)
AST: 17 U/L (ref 15–41)
Albumin: 4 g/dL (ref 3.5–5.0)
Alkaline Phosphatase: 67 U/L (ref 38–126)
Anion gap: 8 (ref 5–15)
BUN: 7 mg/dL (ref 6–20)
CO2: 22 mmol/L (ref 22–32)
Calcium: 9.2 mg/dL (ref 8.9–10.3)
Chloride: 103 mmol/L (ref 98–111)
Creatinine, Ser: 0.44 mg/dL (ref 0.44–1.00)
GFR, Estimated: >60 mL/min (ref >60)
Glucose, Bld: 94 mg/dL (ref 70–99)
Potassium: 3.5 mmol/L (ref 3.5–5.1)
Sodium: 133 mmol/L — ABNORMAL LOW (ref 135–145)
Total Bilirubin: 0.5 mg/dL (ref 0.3–1.2)
Total Protein: 7.3 g/dL (ref 6.5–8.1)

## 2020-07-10 LAB — URINALYSIS, COMPLETE (UACMP) WITH MICROSCOPIC
Bacteria, UA: NONE SEEN
Bilirubin Urine: NEGATIVE
Glucose, UA: NEGATIVE mg/dL
Hgb urine dipstick: NEGATIVE
Ketones, ur: 5 mg/dL — AB
Nitrite: NEGATIVE
Protein, ur: NEGATIVE mg/dL
Specific Gravity, Urine: 1.026 (ref 1.005–1.030)
pH: 6 (ref 5.0–8.0)

## 2020-07-10 LAB — CBC
HCT: 36.3 % (ref 36.0–46.0)
Hemoglobin: 12.3 g/dL (ref 12.0–15.0)
MCH: 30.3 pg (ref 26.0–34.0)
MCHC: 33.9 g/dL (ref 30.0–36.0)
MCV: 89.4 fL (ref 80.0–100.0)
Platelets: 424 10*3/uL — ABNORMAL HIGH (ref 150–400)
RBC: 4.06 MIL/uL (ref 3.87–5.11)
RDW: 12.5 % (ref 11.5–15.5)
WBC: 13.6 10*3/uL — ABNORMAL HIGH (ref 4.0–10.5)
nRBC: 0 % (ref 0.0–0.2)

## 2020-07-10 LAB — POC URINE PREG, ED: Preg Test, Ur: POSITIVE — AB

## 2020-07-10 LAB — HCG, QUANTITATIVE, PREGNANCY: hCG, Beta Chain, Quant, S: 64221 m[IU]/mL — ABNORMAL HIGH (ref <5)

## 2020-07-10 NOTE — ED Notes (Signed)
No answer when called several times from lobby 

## 2020-07-10 NOTE — ED Notes (Signed)
No answer when called several times form lobby, no answer when cell phone # listed in chart called

## 2020-07-10 NOTE — ED Triage Notes (Signed)
Pt in with co lower abd pain and vomiting. States is able to keep fluids down but not solids. Pt denies any diarrhea or dysuria. Pt states she feels shaky, no fever.

## 2020-07-12 ENCOUNTER — Telehealth: Payer: Self-pay | Admitting: Emergency Medicine

## 2020-07-12 NOTE — Telephone Encounter (Signed)
Called patient due to left emergency department before provider exam to inquire about condition and follow up plans. Number not in service.

## 2020-07-14 ENCOUNTER — Emergency Department: Payer: Medicaid Other

## 2020-07-14 ENCOUNTER — Emergency Department
Admission: EM | Admit: 2020-07-14 | Discharge: 2020-07-14 | Discharge status: Against Medical Advice | Payer: Medicaid Other | Attending: Emergency Medicine | Admitting: Emergency Medicine

## 2020-07-14 ENCOUNTER — Encounter: Payer: Self-pay | Admitting: Emergency Medicine

## 2020-07-14 ENCOUNTER — Other Ambulatory Visit: Payer: Self-pay

## 2020-07-14 DIAGNOSIS — N644 Mastodynia: Secondary | ICD-10-CM | POA: Diagnosis not present

## 2020-07-14 DIAGNOSIS — O2691 Pregnancy related conditions, unspecified, first trimester: Secondary | ICD-10-CM | POA: Diagnosis not present

## 2020-07-14 DIAGNOSIS — Z91040 Latex allergy status: Secondary | ICD-10-CM | POA: Diagnosis not present

## 2020-07-14 DIAGNOSIS — O99711 Diseases of the skin and subcutaneous tissue complicating pregnancy, first trimester: Secondary | ICD-10-CM | POA: Insufficient documentation

## 2020-07-14 DIAGNOSIS — O219 Vomiting of pregnancy, unspecified: Secondary | ICD-10-CM | POA: Insufficient documentation

## 2020-07-14 DIAGNOSIS — O91011 Infection of nipple associated with pregnancy, first trimester: Secondary | ICD-10-CM | POA: Diagnosis not present

## 2020-07-14 DIAGNOSIS — L0291 Cutaneous abscess, unspecified: Secondary | ICD-10-CM

## 2020-07-14 DIAGNOSIS — Z3A10 10 weeks gestation of pregnancy: Secondary | ICD-10-CM | POA: Diagnosis not present

## 2020-07-14 DIAGNOSIS — L02811 Cutaneous abscess of head [any part, except face]: Secondary | ICD-10-CM | POA: Insufficient documentation

## 2020-07-14 DIAGNOSIS — F17210 Nicotine dependence, cigarettes, uncomplicated: Secondary | ICD-10-CM | POA: Insufficient documentation

## 2020-07-14 DIAGNOSIS — O26891 Other specified pregnancy related conditions, first trimester: Secondary | ICD-10-CM | POA: Diagnosis not present

## 2020-07-14 DIAGNOSIS — O99331 Smoking (tobacco) complicating pregnancy, first trimester: Secondary | ICD-10-CM | POA: Diagnosis not present

## 2020-07-14 DIAGNOSIS — R102 Pelvic and perineal pain: Secondary | ICD-10-CM | POA: Diagnosis not present

## 2020-07-14 DIAGNOSIS — N6452 Nipple discharge: Secondary | ICD-10-CM | POA: Insufficient documentation

## 2020-07-14 DIAGNOSIS — O91111 Abscess of breast associated with pregnancy, first trimester: Secondary | ICD-10-CM | POA: Diagnosis not present

## 2020-07-14 DIAGNOSIS — R109 Unspecified abdominal pain: Secondary | ICD-10-CM | POA: Diagnosis not present

## 2020-07-14 LAB — COMPREHENSIVE METABOLIC PANEL
ALT: 11 U/L (ref 0–44)
AST: 13 U/L — ABNORMAL LOW (ref 15–41)
Albumin: 3.9 g/dL (ref 3.5–5.0)
Alkaline Phosphatase: 67 U/L (ref 38–126)
Anion gap: 7 (ref 5–15)
BUN: 8 mg/dL (ref 6–20)
CO2: 23 mmol/L (ref 22–32)
Calcium: 9.6 mg/dL (ref 8.9–10.3)
Chloride: 104 mmol/L (ref 98–111)
Creatinine, Ser: 0.37 mg/dL — ABNORMAL LOW (ref 0.44–1.00)
GFR, Estimated: >60 mL/min (ref >60)
Glucose, Bld: 99 mg/dL (ref 70–99)
Potassium: 3.8 mmol/L (ref 3.5–5.1)
Sodium: 134 mmol/L — ABNORMAL LOW (ref 135–145)
Total Bilirubin: 0.4 mg/dL (ref 0.3–1.2)
Total Protein: 7.2 g/dL (ref 6.5–8.1)

## 2020-07-14 LAB — CBC
HCT: 37.9 % (ref 36.0–46.0)
Hemoglobin: 12.4 g/dL (ref 12.0–15.0)
MCH: 30.2 pg (ref 26.0–34.0)
MCHC: 32.7 g/dL (ref 30.0–36.0)
MCV: 92.4 fL (ref 80.0–100.0)
Platelets: 402 10*3/uL — ABNORMAL HIGH (ref 150–400)
RBC: 4.1 MIL/uL (ref 3.87–5.11)
RDW: 12.6 % (ref 11.5–15.5)
WBC: 12.9 10*3/uL — ABNORMAL HIGH (ref 4.0–10.5)
nRBC: 0 % (ref 0.0–0.2)

## 2020-07-14 LAB — URINALYSIS, COMPLETE (UACMP) WITH MICROSCOPIC
Bacteria, UA: NONE SEEN
Bilirubin Urine: NEGATIVE
Glucose, UA: NEGATIVE mg/dL
Hgb urine dipstick: NEGATIVE
Ketones, ur: NEGATIVE mg/dL
Leukocytes,Ua: NEGATIVE
Nitrite: NEGATIVE
Protein, ur: NEGATIVE mg/dL
Specific Gravity, Urine: 1.013 (ref 1.005–1.030)
pH: 6 (ref 5.0–8.0)

## 2020-07-14 LAB — HCG, QUANTITATIVE, PREGNANCY: hCG, Beta Chain, Quant, S: 55417 m[IU]/mL — ABNORMAL HIGH (ref <5)

## 2020-07-14 LAB — POC URINE PREG, ED: Preg Test, Ur: POSITIVE — AB

## 2020-07-14 LAB — LIPASE, BLOOD: Lipase: 28 U/L (ref 11–51)

## 2020-07-14 NOTE — ED Provider Notes (Signed)
[facility]-EMERGENCY DEPARTMENT  ____________________________________________  Time seen: Approximately 7:39 PM  I have reviewed the triage vital signs and the nursing notes.   HISTORY  Chief Complaint Nausea and Abdominal Pain   Historian Patient    HPI Virginia Mitchell is a 30 y.o. female presents to the emergency department with multiple medical concerns.  Patient states that she is primarily presenting to the ED with concern for possible pregnancy.  She states that she has been experiencing lower pelvic discomfort.  No vaginal bleeding.  She states that she has been nauseated with some vomiting.  Patient is secondarily concerned for a right parietal abscess and has been having purulent discharge from the right nipple.  She states that she has had a prior history of breast abscess in the past secondary to nipple piercing.  She denies dysuria, hematuria or increased urinary frequency.  No fever or chills at home.  No dyspareunia or changes in vaginal discharge.   Past Medical History:  Diagnosis Date  . Anemia      Immunizations up to date:  Yes.     Past Medical History:  Diagnosis Date  . Anemia     Patient Active Problem List   Diagnosis Date Noted  . Breast abscess 03/07/2020  . Limited prenatal care in third trimester 10/13/2019  . Pregnancy 09/03/2019  . Abdominal pain affecting pregnancy 08/08/2019    Past Surgical History:  Procedure Laterality Date  . DILATION AND CURETTAGE OF UTERUS      Prior to Admission medications   Medication Sig Start Date End Date Taking? Authorizing Provider  acetaminophen (TYLENOL) 500 MG tablet Take 500 mg by mouth every 6 (six) hours as needed. Patient not taking: Reported on 03/07/2020    [provider]  dibucaine (NUPERCAINAL) 1 % OINT Place 1 application rectally as needed for hemorrhoids. Patient not taking: Reported on 03/07/2020 10/16/19   Gustavo Lah, CNM  ferrous sulfate 325 (65 FE) MG tablet Take 1 tablet  (325 mg total) by mouth 2 (two) times daily with a meal. Patient not taking: Reported on 03/07/2020 10/16/19   Gustavo Lah, CNM  ibuprofen (ADVIL) 600 MG tablet Take 1 tablet (600 mg total) by mouth every 6 (six) hours. Patient not taking: Reported on 03/07/2020 10/16/19   Gustavo Lah, CNM  lidocaine (LIDODERM) 5 % Place 1 patch onto the skin every 12 (twelve) hours. Remove & Discard patch within 12 hours or as directed by MD Patient not taking: Reported on 03/07/2020 09/03/19 09/02/20  Chesley Noon, MD  pantoprazole (PROTONIX) 40 MG tablet Take 40 mg by mouth daily. Patient not taking: Reported on 03/07/2020    [provider]  Prenatal Vit-Fe Fumarate-FA (PRENATAL MULTIVITAMIN) TABS tablet Take 1 tablet by mouth daily at 12 noon. Patient not taking: Reported on 03/07/2020    [provider]  senna-docusate (SENOKOT-S) 8.6-50 MG tablet Take 2 tablets by mouth daily as needed for mild constipation. Patient not taking: Reported on 03/07/2020 10/16/19   Gustavo Lah, CNM  witch hazel-glycerin (TUCKS) pad Apply 1 application topically as needed for hemorrhoids. Patient not taking: Reported on 03/07/2020 10/16/19   Gustavo Lah, CNM    Allergies Latex, Sulfa antibiotics, and Penicillins  Family History  Problem Relation Age of Onset  . Diabetes Paternal Grandmother   . Asthma Paternal Grandmother   . Migraines Paternal Grandmother   . Breast cancer Neg Hx   . Ovarian cancer Neg Hx   . Colon cancer Neg  Hx     Social History Social History   Tobacco Use  . Smoking status: Current Every Day Smoker    Packs/day: 0.50    Types: Cigarettes  . Smokeless tobacco: Never Used  Vaping Use  . Vaping Use: Never used  Substance Use Topics  . Alcohol use: Not Currently  . Drug use: Not Currently    Types: Marijuana     Review of Systems  Constitutional: No fever/chills Eyes:  No discharge ENT: No upper respiratory complaints. Respiratory: no cough. No SOB/ use of  accessory muscles to breath Gastrointestinal:   No nausea, no vomiting.  No diarrhea.  No constipation. Genitourinary: Patient has pelvic cramping.  Musculoskeletal: Negative for musculoskeletal pain. Skin: Negative for rash, abrasions, lacerations, ecchymosis.    ____________________________________________   PHYSICAL EXAM:  VITAL SIGNS: ED Triage Vitals  Enc Vitals Group     BP 07/14/20 1713 130/83     Pulse Rate 07/14/20 1713 85     Resp 07/14/20 1713 18     Temp 07/14/20 1713 98.6 F (37 C)     Temp src --      SpO2 07/14/20 1713 100 %     Weight 07/14/20 1714 170 lb (77.1 kg)     Height 07/14/20 1714 5' (1.524 m)     Head Circumference --      Peak Flow --      Pain Score 07/14/20 1714 0     Pain Loc --      Pain Edu? --      Excl. in GC? --      Constitutional: Alert and oriented. Well appearing and in no acute distress. Eyes: Conjunctivae are normal. PERRL. EOMI. Head: Atraumatic. Cardiovascular: Normal rate, regular rhythm. Normal S1 and S2.  Good peripheral circulation. Respiratory: Normal respiratory effort without tachypnea or retractions. Lungs CTAB. Good air entry to the bases with no decreased or absent breath sounds Gastrointestinal: Bowel sounds x 4 quadrants. Soft and nontender to palpation. No guarding or rigidity. No distention. Musculoskeletal: Full range of motion to all extremities. No obvious deformities noted Neurologic:  Normal for age. No gross focal neurologic deficits are appreciated.  Skin:  Skin is warm, dry and intact. No rash noted. Psychiatric: Mood and affect are normal for age. Speech and behavior are normal.   ____________________________________________   LABS (all labs ordered are listed, but only abnormal results are displayed)  Labs Reviewed  COMPREHENSIVE METABOLIC PANEL - Abnormal; Notable for the following components:      Result Value   Sodium 134 (*)    Creatinine, Ser 0.37 (*)    AST 13 (*)    All other components  within normal limits  CBC - Abnormal; Notable for the following components:   WBC 12.9 (*)    Platelets 402 (*)    All other components within normal limits  URINALYSIS, COMPLETE (UACMP) WITH MICROSCOPIC - Abnormal; Notable for the following components:   Color, Urine STRAW (*)    APPearance CLEAR (*)    All other components within normal limits  HCG, QUANTITATIVE, PREGNANCY - Abnormal; Notable for the following components:   hCG, Beta Chain, Quant, S 55,417 (*)    All other components within normal limits  POC URINE PREG, ED - Abnormal; Notable for the following components:   Preg Test, Ur POSITIVE (*)    All other components within normal limits  LIPASE, BLOOD   ____________________________________________  EKG   ____________________________________________  RADIOLOGY I, Orvil Feil,  personally viewed and evaluated these images (plain radiographs) as part of my medical decision making, as well as reviewing the written report by the radiologist.    US OB Comp Less 14 Wks  Result Date: 07/14/2020 CLINICAL DATA:  Cramping. EXAM: OBSTETRIC <14 WK ULTRASOUND TECHNIQUE: Transabdominal ultrasound was performed for evaluation of the gestation as well as the maternal uterus and adnexal regions. COMPARISON:  None. FINDINGS: Intrauterine gestational sac: Single Yolk sac:  Visualized. Embryo:  Visualized. Cardiac Activity: Visualized. Heart Rate: 167 bpm CRL:   30.0 mm   10 w 1 d                  [place] EDC: February 08, 2021 Subchorionic hemorrhage:  None visualized. Maternal uterus/adnexae: The bilateral ovaries are visualized and are normal in appearance. There is no evidence of pelvic free fluid. IMPRESSION: Single, viable intrauterine pregnancy at approximately 10 weeks and 1 day gestation by ultrasound evaluation. Electronically Signed   By: Aram Candela M.D.   On: 07/14/2020 20:36    ____________________________________________    PROCEDURES  Procedure(s) performed:      Procedures     Medications - No data to display   ____________________________________________   INITIAL IMPRESSION / ASSESSMENT AND PLAN / ED COURSE  Pertinent labs & imaging results that were available during my care of the patient were reviewed by me and considered in my medical decision making (see chart for details).      Assessment and Plan:  Breast abscess:  Pelvic cramping 30 year old female presents to the emergency department with multiple medical concerns.  Patient was concern for pelvic cramping and possible pregnancy as well as right breast pain.  Vital signs are reassuring in triage.  On physical exam, patient localizes right breast pain to right nipple and had a prior history of right breast abscess.  Right breast ultrasound was obtained which showed no signs of abscess.  Patient's beta-hCG was appropriately elevated and was downtrending from beta-hCG obtained 4 days ago.  Pelvic ultrasound was obtained which showed single viable intrauterine pregnancy at 167 bpm.  Patient eloped from the ED before results could be communicated.    ____________________________________________  FINAL CLINICAL IMPRESSION(S) / ED DIAGNOSES  Final diagnoses:  Abscess      NEW MEDICATIONS STARTED DURING THIS VISIT:  ED Discharge Orders    None          This chart was dictated using voice recognition software/Dragon. Despite best efforts to proofread, errors can occur which can change the meaning. Any change was purely unintentional.     Gasper Lloyd 07/14/20 2137    Phineas Semen, MD 07/14/20 [phone]

## 2020-07-14 NOTE — ED Notes (Addendum)
See triage note. Pt reports "I sometimes get nauseous when I eat or if I don't eat". Pt reports only took antibiotic for two days then stopped because "it messed up my son's stomach since I breast feed him". Denies fever, SOB, CP, changes in urination or BMs. C/o "the shakes". Pt currently sitting calmly in bed; in NAD. Reports R breast continues to be tender but denies "lump" that used to be there were infection initially noted. Reports used to have her breast pierced but removed jewelry recently due to discomfort and issues while feeding.

## 2020-07-14 NOTE — ED Triage Notes (Signed)
PT to ER with c/o nausea and lower abdominal pain.  States has had some for last 2 weeks.  Pt also reports an "infection in my breast" which she has not taken her abx for due to breast feeding child.  PT denies dysuria or diarrhea.  Denies fevers.

## 2020-07-14 NOTE — ED Notes (Signed)
Per PA, pt left without notifying staff. PA unsure of exactly when pt left but have checked room twice in last 20 minutes and pt has not be present either time.

## 2020-08-03 DIAGNOSIS — Z419 Encounter for procedure for purposes other than remedying health state, unspecified: Secondary | ICD-10-CM | POA: Diagnosis not present

## 2020-08-07 DIAGNOSIS — Z3483 Encounter for supervision of other normal pregnancy, third trimester: Secondary | ICD-10-CM | POA: Insufficient documentation

## 2020-08-21 DIAGNOSIS — Z3482 Encounter for supervision of other normal pregnancy, second trimester: Secondary | ICD-10-CM | POA: Diagnosis not present

## 2020-08-21 LAB — OB RESULTS CONSOLE RUBELLA ANTIBODY, IGM: Rubella: NON-IMMUNE/NOT IMMUNE

## 2020-08-21 LAB — OB RESULTS CONSOLE VARICELLA ZOSTER ANTIBODY, IGG: Varicella: IMMUNE

## 2020-08-21 LAB — OB RESULTS CONSOLE HEPATITIS B SURFACE ANTIGEN: Hepatitis B Surface Ag: NEGATIVE

## 2020-09-02 DIAGNOSIS — Z419 Encounter for procedure for purposes other than remedying health state, unspecified: Secondary | ICD-10-CM | POA: Diagnosis not present

## 2020-09-07 ENCOUNTER — Emergency Department: Payer: Medicaid Other

## 2020-09-07 ENCOUNTER — Encounter: Payer: Self-pay | Admitting: Emergency Medicine

## 2020-09-07 ENCOUNTER — Emergency Department
Admission: EM | Admit: 2020-09-07 | Discharge: 2020-09-07 | Disposition: A | Discharge status: Home / Self Care | Payer: Medicaid Other | Attending: Emergency Medicine | Admitting: Emergency Medicine

## 2020-09-07 ENCOUNTER — Other Ambulatory Visit: Payer: Self-pay

## 2020-09-07 DIAGNOSIS — Z91040 Latex allergy status: Secondary | ICD-10-CM | POA: Diagnosis not present

## 2020-09-07 DIAGNOSIS — N6489 Other specified disorders of breast: Secondary | ICD-10-CM | POA: Diagnosis not present

## 2020-09-07 DIAGNOSIS — N644 Mastodynia: Secondary | ICD-10-CM | POA: Diagnosis present

## 2020-09-07 DIAGNOSIS — N611 Abscess of the breast and nipple: Secondary | ICD-10-CM | POA: Insufficient documentation

## 2020-09-07 DIAGNOSIS — F17210 Nicotine dependence, cigarettes, uncomplicated: Secondary | ICD-10-CM | POA: Diagnosis not present

## 2020-09-07 LAB — COMPREHENSIVE METABOLIC PANEL
ALT: 11 U/L (ref 0–44)
AST: 13 U/L — ABNORMAL LOW (ref 15–41)
Albumin: 3.2 g/dL — ABNORMAL LOW (ref 3.5–5.0)
Alkaline Phosphatase: 60 U/L (ref 38–126)
Anion gap: 8 (ref 5–15)
BUN: 6 mg/dL (ref 6–20)
CO2: 23 mmol/L (ref 22–32)
Calcium: 9.2 mg/dL (ref 8.9–10.3)
Chloride: 105 mmol/L (ref 98–111)
Creatinine, Ser: 0.44 mg/dL (ref 0.44–1.00)
GFR, Estimated: >60 mL/min (ref >60)
Glucose, Bld: 94 mg/dL (ref 70–99)
Potassium: 3.7 mmol/L (ref 3.5–5.1)
Sodium: 136 mmol/L (ref 135–145)
Total Bilirubin: 0.3 mg/dL (ref 0.3–1.2)
Total Protein: 6.6 g/dL (ref 6.5–8.1)

## 2020-09-07 LAB — CBC WITH DIFFERENTIAL/PLATELET
Abs Immature Granulocytes: 0.09 10*3/uL — ABNORMAL HIGH (ref 0.00–0.07)
Basophils Absolute: 0.1 10*3/uL (ref 0.0–0.1)
Basophils Relative: 0 %
Eosinophils Absolute: 0.3 10*3/uL (ref 0.0–0.5)
Eosinophils Relative: 2 %
HCT: 35.1 % — ABNORMAL LOW (ref 36.0–46.0)
Hemoglobin: 11.8 g/dL — ABNORMAL LOW (ref 12.0–15.0)
Immature Granulocytes: 1 %
Lymphocytes Relative: 23 %
Lymphs Abs: 3.2 10*3/uL (ref 0.7–4.0)
MCH: 30.5 pg (ref 26.0–34.0)
MCHC: 33.6 g/dL (ref 30.0–36.0)
MCV: 90.7 fL (ref 80.0–100.0)
Monocytes Absolute: 0.9 10*3/uL (ref 0.1–1.0)
Monocytes Relative: 6 %
Neutro Abs: 9.7 10*3/uL — ABNORMAL HIGH (ref 1.7–7.7)
Neutrophils Relative %: 68 %
Platelets: 372 10*3/uL (ref 150–400)
RBC: 3.87 MIL/uL (ref 3.87–5.11)
RDW: 12.9 % (ref 11.5–15.5)
WBC: 14.2 10*3/uL — ABNORMAL HIGH (ref 4.0–10.5)
nRBC: 0 % (ref 0.0–0.2)

## 2020-09-07 MED ORDER — ACETAMINOPHEN 325 MG PO TABS
650.0000 mg | ORAL_TABLET | Freq: Once | ORAL | Status: AC
  Administered 2020-09-07: 650 mg via ORAL
  Filled 2020-09-07: qty 2

## 2020-09-07 MED ORDER — LIDOCAINE HCL (PF) 1 % IJ SOLN
5.0000 mL | Freq: Once | INTRAMUSCULAR | Status: DC
  Filled 2020-09-07: qty 5

## 2020-09-07 MED ORDER — CLINDAMYCIN HCL 150 MG PO CAPS
450.0000 mg | ORAL_CAPSULE | Freq: Once | ORAL | Status: AC
  Administered 2020-09-07: 450 mg via ORAL
  Filled 2020-09-07: qty 3

## 2020-09-07 MED ORDER — CLINDAMYCIN HCL 150 MG PO CAPS: 450.0000 mg | ORAL_CAPSULE | Freq: Three times a day (TID) | ORAL | 0 refills | Status: AC

## 2020-09-07 NOTE — ED Triage Notes (Signed)
First Nurse Note:  C/O right breast infection.  States has been ongoing for 'a while'  Seen through ED for same (in March) but states unable to take prescribed antibiotics due to being pregnant.  Has been seen at [facility] for OB on 08/21/2020-- unclear if infection was addressed at that time.

## 2020-09-07 NOTE — ED Notes (Signed)
ED Provider at bedside. 

## 2020-09-07 NOTE — ED Triage Notes (Signed)
Pt comes into the ED via POV c/o right breast abscess that has been ongoing for 7-8 months.  Pt states she was given antibiotics but she stopped them because she was breast feeding and it was upsetting her infants stomach.  Pt is currently pregnant again and is about 18 weeks.  Pt admits the breast is very red with shooting pains.

## 2020-09-07 NOTE — ED Provider Notes (Signed)
[facility] Emergency Department Provider Note  ____________________________________________   Event Date/Time   First MD Initiated Contact with Patient 09/07/20 1651     (approximate)  I have reviewed the triage vital signs and the nursing notes.   HISTORY  Chief Complaint Abscess   HPI Virginia Mitchell is a 30 y.o. female who reports to the emergency department for evaluation of right breast pain.  Patient states that she has history of a breast abscess treated in Oct of 2021 with ABX. She states she was breastfeeding at the time and d/c the ABX early due to it upsetting her baby's stomach. She was then asymptomatic until March of 22, at which time she presented again to the ER, and [place] was negative at that time and no ABX were given. She presents today because she has had recurrence of her pain over the last few days. She reports initially she had some mild purulent discharge from the nipple, but that it subsided after 1 day. She denies fever, chills, or any other complaints. Of note, she is currently 5 months pregnant, but denies any abdominal or vaginal complaints.         Past Medical History:  Diagnosis Date  . Anemia     Patient Active Problem List   Diagnosis Date Noted  . Breast abscess 03/07/2020  . Limited prenatal care in third trimester 10/13/2019  . Pregnancy 09/03/2019  . Abdominal pain affecting pregnancy 08/08/2019    Past Surgical History:  Procedure Laterality Date  . DILATION AND CURETTAGE OF UTERUS      Prior to Admission medications   Medication Sig Start Date End Date Taking? Authorizing Provider  clindamycin (CLEOCIN) 150 MG capsule Take 3 capsules (450 mg total) by mouth 3 (three) times daily for 10 days. 09/07/20 09/17/20 Yes Lucy Chris, PA  acetaminophen (TYLENOL) 500 MG tablet Take 500 mg by mouth every 6 (six) hours as needed. Patient not taking: Reported on 03/07/2020    [provider]  dibucaine  (NUPERCAINAL) 1 % OINT Place 1 application rectally as needed for hemorrhoids. Patient not taking: Reported on 03/07/2020 10/16/19   Gustavo Lah, CNM  ferrous sulfate 325 (65 FE) MG tablet Take 1 tablet (325 mg total) by mouth 2 (two) times daily with a meal. Patient not taking: Reported on 03/07/2020 10/16/19   Gustavo Lah, CNM  ibuprofen (ADVIL) 600 MG tablet Take 1 tablet (600 mg total) by mouth every 6 (six) hours. Patient not taking: Reported on 03/07/2020 10/16/19   Gustavo Lah, CNM  pantoprazole (PROTONIX) 40 MG tablet Take 40 mg by mouth daily. Patient not taking: Reported on 03/07/2020    [provider]  Prenatal Vit-Fe Fumarate-FA (PRENATAL MULTIVITAMIN) TABS tablet Take 1 tablet by mouth daily at 12 noon. Patient not taking: Reported on 03/07/2020    [provider]  senna-docusate (SENOKOT-S) 8.6-50 MG tablet Take 2 tablets by mouth daily as needed for mild constipation. Patient not taking: Reported on 03/07/2020 10/16/19   Gustavo Lah, CNM  witch hazel-glycerin (TUCKS) pad Apply 1 application topically as needed for hemorrhoids. Patient not taking: Reported on 03/07/2020 10/16/19   Gustavo Lah, CNM    Allergies Latex, Sulfa antibiotics, and Penicillins  Family History  Problem Relation Age of Onset  . Diabetes Paternal Grandmother   . Asthma Paternal Grandmother   . Migraines Paternal Grandmother   . Breast cancer Neg Hx   . Ovarian cancer Neg Hx   .  Colon cancer Neg Hx     Social History Social History   Tobacco Use  . Smoking status: Current Every Day Smoker    Packs/day: 0.50    Types: Cigarettes  . Smokeless tobacco: Never Used  Vaping Use  . Vaping Use: Never used  Substance Use Topics  . Alcohol use: Not Currently  . Drug use: Not Currently    Types: Marijuana    Comment: not currently    Review of Systems Constitutional: No fever/chills Eyes: No visual changes. ENT: No sore throat. Cardiovascular: + right sided breast pain,  Denies chest pain. Respiratory: Denies shortness of breath. Gastrointestinal: No abdominal pain.  No nausea, no vomiting.  No diarrhea.  No constipation. Genitourinary: Negative for dysuria. Musculoskeletal: Negative for back pain. Skin: Negative for rash. Neurological: Negative for headaches, focal weakness or numbness.   ____________________________________________   PHYSICAL EXAM:  VITAL SIGNS: ED Triage Vitals  Enc Vitals Group     BP 09/07/20 1650 131/87     Pulse Rate 09/07/20 1650 (!) 104     Resp 09/07/20 1650 19     Temp 09/07/20 1650 97.9 F (36.6 C)     Temp Source 09/07/20 1650 Oral     SpO2 09/07/20 1650 100 %     Weight 09/07/20 1651 185 lb (83.9 kg)     Height 09/07/20 1651 5' (1.524 m)     Head Circumference --      Peak Flow --      Pain Score 09/07/20 1651 0     Pain Loc --      Pain Edu? --      Excl. in GC? --    Constitutional: Alert and oriented. Nontoxic, very Well appearing and in no acute distress. Eyes: Conjunctivae are normal. PERRL. EOMI. Head: Atraumatic. Nose: No congestion/rhinnorhea. Mouth/Throat: Mucous membranes are moist.  Oropharynx non-erythematous. Neck: No stridor.   Breast: the left breast is non-tender, non-erythematous with no lumps, lesions or nipple discharge. The right breast has no current nipple discharge, she has a 3cmx2 cm palpable, tender density under the areola. There is mild erythema just inferior to the midline of the areola with warmth noted. Cardiovascular: Normal rate, regular rhythm. Grossly normal heart sounds.  Good peripheral circulation. Respiratory: Normal respiratory effort.  No retractions. Lungs CTAB. Gastrointestinal: Soft and nontender. No distention. No abdominal bruits. No CVA tenderness. Musculoskeletal: No lower extremity tenderness nor edema.  No joint effusions. Neurologic:  Normal speech and language. No gross focal neurologic deficits are appreciated. No gait instability. Skin:  Skin is warm, dry  and intact. No rash noted. Psychiatric: Mood and affect are normal. Speech and behavior are normal.  ____________________________________________   LABS (all labs ordered are listed, but only abnormal results are displayed)  Labs Reviewed  COMPREHENSIVE METABOLIC PANEL - Abnormal; Notable for the following components:      Result Value   Albumin 3.2 (*)    AST 13 (*)    All other components within normal limits  CBC WITH DIFFERENTIAL/PLATELET - Abnormal; Notable for the following components:   WBC 14.2 (*)    Hemoglobin 11.8 (*)    HCT 35.1 (*)    Neutro Abs 9.7 (*)    Abs Immature Granulocytes 0.09 (*)    All other components within normal limits   ____________________________________________  RADIOLOGY  Official radiology report(s): US BREAST LTD UNI RIGHT INC AXILLA  Result Date: 09/07/2020 CLINICAL DATA:  Findings suspicious for breast abscess EXAM: ULTRASOUND OF THE RIGHT  BREAST COMPARISON:  07/14/2020 FINDINGS: Targeted ultrasound is performed, showing a 1.9 x 1.2 x 1.2 cm hypoechoic area with internal echoes likely representing a focal abscess. This lies in the 6 o'clock position 1 cm from the nipple. IMPRESSION: Findings consistent with a focal abscess in the right breast at the 6 o'clock position 1 cm from the nipple. Electronically Signed   By: Alcide Clever M.D.   On: 09/07/2020 18:00    ____________________________________________   INITIAL IMPRESSION / ASSESSMENT AND PLAN / ED COURSE  As part of my medical decision making, I reviewed the following data within the electronic MEDICAL RECORD NUMBER Nursing notes reviewed and incorporated, Evaluated by EM attending Dr. Fuller Plan and Notes from prior ED visits        Patient is a 30 yo female who presents to the ER for evaluation of right breast pain and concern for abscess. She describes no concern of systemic symptoms such as fever, chills or other. See HPI for further details. In triage, patient is mildly tachycardic, but  remaining vitals are unremarkable. On PE, patient is very well appearing, non-toxic. She does have a palpable mass just under the center of the areola. US imaging was obtained and demonstrates a 1/9cmx 1.2cm x 1.2 cm area concerning for focal abscess. Labs were obtained and demonstrate a leukocytosis of 14.2 with left shift, but remainder of labs are grossly unremarkable. Overall, patient looks very reassuring clinically. Will initiate trial of Clindamycin with close outpatient follow up, as I do not feel this is amenable to needle aspiration drainage here in the ER. Patient also seen and evaluated by attending Dr. Fuller Plan, who agrees with patient's work up and disposition. Patient is agreeable to plan and return precautions were discussed.       ____________________________________________   FINAL CLINICAL IMPRESSION(S) / ED DIAGNOSES  Final diagnoses:  Breast abscess     ED Discharge Orders         Ordered    clindamycin (CLEOCIN) 150 MG capsule  3 times daily        09/07/20 1931          *Please note:  VANNARY GREENING was evaluated in Emergency Department on 09/07/2020 for the symptoms described in the history of present illness. She was evaluated in the context of the global COVID-19 pandemic, which necessitated consideration that the patient might be at risk for infection with the SARS-CoV-2 virus that causes COVID-19. Institutional protocols and algorithms that pertain to the evaluation of patients at risk for COVID-19 are in a state of rapid change based on information released by regulatory bodies including the CDC and federal and state organizations. These policies and algorithms were followed during the patient's care in the ED.  Some ED evaluations and interventions may be delayed as a result of limited staffing during and the pandemic.*   Note:  This document was prepared using Dragon voice recognition software and may include unintentional dictation errors.   Lucy Chris, PA 09/07/20 2018    Concha Se, MD 09/08/20 [phone]

## 2020-09-07 NOTE — Discharge Instructions (Addendum)
Please take your antibiotics as prescribed. Follow up with General Surgery as well as your OB. Return to ER with any worsening.

## 2020-09-10 ENCOUNTER — Telehealth: Payer: Self-pay

## 2020-09-10 NOTE — Telephone Encounter (Signed)
Transition Care Management Follow-up Telephone Call  Date of discharge and from where: [facility] 09/07/2020  How have you been since you were released from the hospital? Doing well  Any questions or concerns? No  Items Reviewed:  Did the pt receive and understand the discharge instructions provided? Yes   Medications obtained and verified? Yes   Other? No   Any new allergies since your discharge? No   Dietary orders reviewed? Yes  Do you have support at home? Yes   Home Care and Equipment/Supplies: Were home health services ordered? No If so, what is the name of the agency?  Has the agency set up a time to come to the patient's home? not applicable Were any new equipment or medical supplies ordered?  No What is the name of the medical supply agency?  Were you able to get the supplies/equipment? not applicable Do you have any questions related to the use of the equipment or supplies? No  Functional Questionnaire: (I = Independent and D = Dependent) ADLs: I  Bathing/Dressing- I  Meal Prep- I  Eating- I  Maintaining continence- I  Transferring/Ambulation- I  Managing Meds- I  Follow up appointments reviewed:   PCP Hospital f/u appt confirmed? No  .  Specialist Hospital f/u appt confirmed? Yes  Scheduled to see [facility]  On 09/18/2020 .  Are transportation arrangements needed? No   If their condition worsens, is the pt aware to call PCP or go to the Emergency Dept.? Yes  Was the patient provided with contact information for the PCP's office or ED? Yes  Was to pt encouraged to call back with questions or concerns? Yes

## 2020-09-20 DIAGNOSIS — Z3482 Encounter for supervision of other normal pregnancy, second trimester: Secondary | ICD-10-CM | POA: Diagnosis not present

## 2020-09-24 ENCOUNTER — Ambulatory Visit: Payer: Self-pay | Admitting: Surgery

## 2020-10-03 DIAGNOSIS — Z419 Encounter for procedure for purposes other than remedying health state, unspecified: Secondary | ICD-10-CM | POA: Diagnosis not present

## 2020-10-08 ENCOUNTER — Encounter: Payer: Self-pay | Admitting: Surgery

## 2020-10-08 ENCOUNTER — Other Ambulatory Visit: Payer: Self-pay

## 2020-10-08 ENCOUNTER — Ambulatory Visit (INDEPENDENT_AMBULATORY_CARE_PROVIDER_SITE_OTHER): Payer: Medicaid Other | Admitting: Surgery

## 2020-10-08 VITALS — BP 126/78 | HR 114 | Temp 99.5°F | Ht 60.0 in | Wt 187.0 lb

## 2020-10-08 DIAGNOSIS — N6011 Diffuse cystic mastopathy of right breast: Secondary | ICD-10-CM | POA: Diagnosis not present

## 2020-10-08 NOTE — Patient Instructions (Addendum)
We will call you the end of November to schedule a follow up appointment with Dr.Pabon. Please call if you have questions or concerns.  Mastitis  Mastitis is irritation and swelling (inflammation) in an area of the breast. It is caused by an infection that occurs when germs (bacteria) enter the skin. This most often happens to breastfeeding mothers, but it can happen to other women too as well as some men. What are the causes? This condition is caused by:  Germs. This can happen when germs enter the breast through cuts or openings in the skin.  A plugged milk duct. This happens when something blocks the flow of milk in the breast.  Nipple piercing. The pierced area can allow germs to enter the breast.  Some types of breast cancer. What are the signs or symptoms?  Swelling, redness, and pain in the breast.  Swelling of the glands under the arm.  Fluid flowing from the nipple.  Fever.  Chills.  Vomiting or feeling like you may vomit.  Fast heart rate.  Feeling very tired.  Headache.  Muscle aches.  A build-up of pus. This is also called an abscess. How is this treated? This condition may be treated with:  Using heat or cold compresses.  Taking medicine for pain.  Taking antibiotic medicine.  Rest.  Drinking plenty of fluid.  Removing fluid with a needle, if an abscess has developed. Follow these instructions at home: If you are breastfeeding:  Keep emptying your breasts by breastfeeding or by using a breast pump. ? Ask your doctor if you should make changes to your breast feeding or pumping routine.  During breastfeeding, empty the first breast before going to the other breast. Use a breast pump if your baby is not emptying your breasts.  Keep your nipples clean and dry.  Massage your breasts during feeding or pumping as told by your doctor.  If told, put moist heat on the affected area of your breast right before breastfeeding or pumping. Use the heat  source that your doctor tells you to use.  If told, put ice on the affected area of your breast right after breastfeeding or pumping. To do this: ? Put ice in a plastic bag. ? Place a towel between your skin and the bag. ? Leave the ice on for 20 minutes. ? Take off the ice if your skin turns bright red. This is very important. If you cannot feel pain, heat, or cold, you have a greater risk of damage to the area.  If you go back to work, pump your breasts while at work.  Avoid letting your breasts get overly filled with milk (engorged).   Medicines  Take over-the-counter and prescription medicines only as told by your doctor.  If you were prescribed an antibiotic medicine, take it as told by your doctor. Do not stop taking it even if you start to feel better. General instructions  Do not wear a tight or underwire bra. Wear a soft support bra.  Drink enough fluid to keep your pee (urine) pale yellow. This is important if you have a fever.  Get plenty of rest.  Keep all follow-up visits. Contact a doctor if:  You have pus-like fluid leaking from your breast.  You have a fever.  Your symptoms do not get better within 2 days. Get help right away if:  Your pain and swelling are getting worse.  Your pain is not helped by medicine.  You have a red line going  from your breast toward your armpit. Summary  Mastitis is irritation and swelling in an area of the breast.  If you were prescribed an antibiotic medicine, do not stop taking it even if you start to feel better.  Drink more fluids and get plenty of rest.  Contact a doctor if your symptoms do not get better within 2 days. This information is not intended to replace advice given to you by your health care provider. Make sure you discuss any questions you have with your health care provider. Document Revised: 02/20/2020 Document Reviewed: 02/20/2020 Elsevier Patient Education  2021 [employer].

## 2020-10-10 ENCOUNTER — Encounter: Payer: Self-pay | Admitting: Surgery

## 2020-10-10 NOTE — Progress Notes (Signed)
Patient ID: Virginia Mitchell, female   DOB: [birth year], 30 y.o.   MRN: [id number]  HPI Virginia Mitchell is a 30 y.o. female seen in consultation at the request of Dr.Funke for a right sided breast infection/cellulitis.  She reports that she has been having issues with her breast for the last several months.  More recently started having pain that was intermittent moderate in severity.  There was evidence of some shortness of some point time but that has resolved.  She more recently went to the emergency room and ultrasound revealed evidence of a small retrorenal abscess.  She was treated with antibiotics appropriately.  She has had some breast discomfort and mastitis that have been recurrent for the last year or so.  She is currently pregnant. ReSound personally reviewed showing evidence of a small abscess on the right breast.  1.2 cm. Recent CBC and CMP were normal except some mild reactive leukocytosis. HPI  Past Medical History:  Diagnosis Date  . Anemia     Past Surgical History:  Procedure Laterality Date  . DILATION AND CURETTAGE OF UTERUS      Family History  Problem Relation Age of Onset  . Diabetes Paternal Grandmother   . Asthma Paternal Grandmother   . Migraines Paternal Grandmother   . Breast cancer Neg Hx   . Ovarian cancer Neg Hx   . Colon cancer Neg Hx     Social History Social History   Tobacco Use  . Smoking status: Current Every Day Smoker    Packs/day: 0.50    Types: Cigarettes  . Smokeless tobacco: Never Used  Vaping Use  . Vaping Use: Never used  Substance Use Topics  . Alcohol use: Not Currently  . Drug use: Not Currently    Types: Marijuana    Comment: not currently    Allergies  Allergen Reactions  . Latex Hives  . Sulfa Antibiotics Shortness Of Breath  . Penicillins Other (See Comments)    Reaction when little.     Current Outpatient Medications  Medication Sig Dispense Refill  . acetaminophen (TYLENOL) 500 MG tablet Take 500 mg by  mouth every 6 (six) hours as needed.    . dibucaine (NUPERCAINAL) 1 % OINT Place 1 application rectally as needed for hemorrhoids.    . ferrous sulfate 325 (65 FE) MG tablet Take 1 tablet (325 mg total) by mouth 2 (two) times daily with a meal.  3  . ibuprofen (ADVIL) 600 MG tablet Take 1 tablet (600 mg total) by mouth every 6 (six) hours. 60 tablet 3  . pantoprazole (PROTONIX) 40 MG tablet Take 40 mg by mouth daily.    . Prenatal Vit-Fe Fumarate-FA (PRENATAL MULTIVITAMIN) TABS tablet Take 1 tablet by mouth daily at 12 noon.    . senna-docusate (SENOKOT-S) 8.6-50 MG tablet Take 2 tablets by mouth daily as needed for mild constipation.    Marland Kitchen witch hazel-glycerin (TUCKS) pad Apply 1 application topically as needed for hemorrhoids. 40 each 12   No current facility-administered medications for this visit.     Review of Systems Full ROS  was asked and was negative except for the information on the HPI  Physical Exam Blood pressure 126/78, pulse (!) 114, temperature 99.5 F (37.5 C), temperature source Oral, height 5' (1.524 m), weight 187 lb (84.8 kg), last menstrual period 04/30/2020, SpO2 98 %, currently breastfeeding. CONSTITUTIONAL: NAD EYES: Pupils are equal, round,  Sclera are non-icteric. EARS, NOSE, MOUTH AND THROAT: She is wearing a amsk. Hearing  is intact to voice. LYMPH NODES:  Lymph nodes in the neck are normal. RESPIRATORY:  Lungs are clear. There is normal respiratory effort, with equal breath sounds bilaterally, and without pathologic use of accessory muscles. CARDIOVASCULAR: Heart is regular without murmurs, gallops, or rubs. BREAST: Right breast is mildly tender to palpation.  There is no definitive palpable masses.  There is a little bit of induration in the retroareolar area.  Left breast is completely normal.  There is no evidence of any lymphadenopathy. GI: The abdomen is  soft, nontender, and nondistended. There are no palpable masses. There is no hepatosplenomegaly. There  are normal bowel sounds in all quadrants. GU: Rectal deferred.   MUSCULOSKELETAL: Normal muscle strength and tone. No cyanosis or edema.   SKIN: Turgor is good and there are no pathologic skin lesions or ulcers. NEUROLOGIC: Motor and sensation is grossly normal. Cranial nerves are grossly intact. PSYCH:  Oriented to person, place and time. Affect is normal.  Data Reviewed  I have personally reviewed the patient's imaging, laboratory findings and medical records.    Assessment/Plan 30 year old female with intrauterine pregnancy and right-sided mastitis.  Currently there is no evidence of an abscess.  I would like to follow the patient up in a few months after she delivers.  For now no need for further antibiotic therapy or surgical intervention at this time.  Time spent with the patient was 50 minutes, with more than 50% of the time spent in face-to-face education, counseling and care coordination.     Sterling Big, MD FACS General Surgeon 10/10/2020, 3:28 PM

## 2020-10-17 ENCOUNTER — Encounter: Payer: Self-pay | Admitting: Emergency Medicine

## 2020-10-17 ENCOUNTER — Other Ambulatory Visit: Payer: Self-pay

## 2020-10-17 ENCOUNTER — Ambulatory Visit
Admit: 2020-10-17 | Discharge: 2020-10-17 | Disposition: A | Discharge status: Home / Self Care | Payer: Medicaid Other | Attending: Nurse Practitioner | Admitting: Nurse Practitioner

## 2020-10-17 ENCOUNTER — Emergency Department
Admission: EM | Admit: 2020-10-17 | Discharge: 2020-10-17 | Disposition: A | Discharge status: Home / Self Care | Payer: Medicaid Other | Attending: Emergency Medicine | Admitting: Emergency Medicine

## 2020-10-17 DIAGNOSIS — Z91040 Latex allergy status: Secondary | ICD-10-CM | POA: Insufficient documentation

## 2020-10-17 DIAGNOSIS — O91119 Abscess of breast associated with pregnancy, unspecified trimester: Secondary | ICD-10-CM | POA: Insufficient documentation

## 2020-10-17 DIAGNOSIS — L0291 Cutaneous abscess, unspecified: Secondary | ICD-10-CM

## 2020-10-17 DIAGNOSIS — Z3A00 Weeks of gestation of pregnancy not specified: Secondary | ICD-10-CM | POA: Insufficient documentation

## 2020-10-17 DIAGNOSIS — N611 Abscess of the breast and nipple: Secondary | ICD-10-CM | POA: Diagnosis not present

## 2020-10-17 DIAGNOSIS — F17210 Nicotine dependence, cigarettes, uncomplicated: Secondary | ICD-10-CM | POA: Insufficient documentation

## 2020-10-17 LAB — BASIC METABOLIC PANEL
Anion gap: 11 (ref 5–15)
BUN: 6 mg/dL (ref 6–20)
CO2: 23 mmol/L (ref 22–32)
Calcium: 9.3 mg/dL (ref 8.9–10.3)
Chloride: 102 mmol/L (ref 98–111)
Creatinine, Ser: 0.39 mg/dL — ABNORMAL LOW (ref 0.44–1.00)
GFR, Estimated: >60 mL/min (ref >60)
Glucose, Bld: 75 mg/dL (ref 70–99)
Potassium: 3.7 mmol/L (ref 3.5–5.1)
Sodium: 136 mmol/L (ref 135–145)

## 2020-10-17 LAB — CBC WITH DIFFERENTIAL/PLATELET
Abs Immature Granulocytes: 0.07 10*3/uL (ref 0.00–0.07)
Basophils Absolute: 0.1 10*3/uL (ref 0.0–0.1)
Basophils Relative: 0 %
Eosinophils Absolute: 0.2 10*3/uL (ref 0.0–0.5)
Eosinophils Relative: 1 %
HCT: 32.7 % — ABNORMAL LOW (ref 36.0–46.0)
Hemoglobin: 11.3 g/dL — ABNORMAL LOW (ref 12.0–15.0)
Immature Granulocytes: 1 %
Lymphocytes Relative: 18 %
Lymphs Abs: 2.6 10*3/uL (ref 0.7–4.0)
MCH: 30.8 pg (ref 26.0–34.0)
MCHC: 34.6 g/dL (ref 30.0–36.0)
MCV: 89.1 fL (ref 80.0–100.0)
Monocytes Absolute: 1.2 10*3/uL — ABNORMAL HIGH (ref 0.1–1.0)
Monocytes Relative: 8 %
Neutro Abs: 10.4 10*3/uL — ABNORMAL HIGH (ref 1.7–7.7)
Neutrophils Relative %: 72 %
Platelets: 383 10*3/uL (ref 150–400)
RBC: 3.67 MIL/uL — ABNORMAL LOW (ref 3.87–5.11)
RDW: 12.9 % (ref 11.5–15.5)
WBC: 14.6 10*3/uL — ABNORMAL HIGH (ref 4.0–10.5)
nRBC: 0 % (ref 0.0–0.2)

## 2020-10-17 MED ORDER — CLINDAMYCIN HCL 300 MG PO CAPS: 300.0000 mg | ORAL_CAPSULE | Freq: Three times a day (TID) | ORAL | 0 refills | Status: AC

## 2020-10-17 MED ORDER — CLINDAMYCIN HCL 150 MG PO CAPS
300.0000 mg | ORAL_CAPSULE | Freq: Once | ORAL | Status: AC
  Administered 2020-10-17: 300 mg via ORAL
  Filled 2020-10-17: qty 2

## 2020-10-17 NOTE — ED Notes (Signed)
See triage note  Presents with pain to right breast area   Has had recurrent infections to same breast area

## 2020-10-17 NOTE — ED Triage Notes (Signed)
Pt comes into the ED via POV c/o right breast pain and possible abscess.  PT states she has been having problems with this breast and recurrent infections.  Pt has completed last round of abx over a month ago.  Pt in NAD at this time.

## 2020-10-17 NOTE — Discharge Instructions (Addendum)
Take the antibiotic as directed. Follow-up with General Surgery for wound care.

## 2020-10-17 NOTE — ED Notes (Signed)
Back from us

## 2020-10-18 ENCOUNTER — Telehealth: Payer: Self-pay | Admitting: *Deleted

## 2020-10-18 NOTE — Telephone Encounter (Signed)
Transition Care Management Follow-up Telephone Call Date of discharge and from where: 10/17/2020 [facility] ED How have you been since you were released from the hospital? "Okay" Any questions or concerns? No  Items Reviewed: Did the pt receive and understand the discharge instructions provided? Yes  Medications obtained and verified? Yes  Other? No  Any new allergies since your discharge? No  Dietary orders reviewed? No Do you have support at home? No    Functional Questionnaire: (I = Independent and D = Dependent) ADLs: I  Bathing/Dressing- I  Meal Prep- I  Eating- I  Maintaining continence- I  Transferring/Ambulation- I  Managing Meds- I  Follow up appointments reviewed:  PCP Hospital f/u appt confirmed? No   Specialist Hospital f/u appt confirmed? No   Are transportation arrangements needed?  N/A If their condition worsens, is the pt aware to call PCP or go to the Emergency Dept.? Yes Was the patient provided with contact information for the PCP's office or ED? Yes Was to pt encouraged to call back with questions or concerns? Yes

## 2020-10-18 NOTE — ED Provider Notes (Signed)
[facility] Emergency Department Provider Note ____________________________________________  Time seen: 1429  I have reviewed the triage vital signs and the nursing notes.  HISTORY  Chief Complaint  Breast Pain   HPI Virginia Mitchell is a 30 y.o. female presents distal to the ED for evaluation of ongoing right breast pain and abscess formation.  Patient reports recurrent symptoms in this breast with multiple spontaneous abscesses.  She recently completed a course of antibiotics about a month ago, and had a visit with the general surgeon about 2 weeks prior.  He advised that he would follow the patient in November, in the postpartum state for definitive management.  She presents today reporting increased pain, tenderness, and firmness but she denies any spontaneous purulent drainage, nipple discharge, or fevers.  Past Medical History:  Diagnosis Date   Anemia     Patient Active Problem List   Diagnosis Date Noted   Breast abscess 03/07/2020   Limited prenatal care in third trimester 10/13/2019   Pregnancy 09/03/2019   Abdominal pain affecting pregnancy 08/08/2019    Past Surgical History:  Procedure Laterality Date   DILATION AND CURETTAGE OF UTERUS      Prior to Admission medications   Medication Sig Start Date End Date Taking? Authorizing Provider  clindamycin (CLEOCIN) 300 MG capsule Take 1 capsule (300 mg total) by mouth 3 (three) times daily for 10 days. 10/17/20 10/27/20 Yes Coby Antrobus, Charlesetta Ivory, PA-C  acetaminophen (TYLENOL) 500 MG tablet Take 500 mg by mouth every 6 (six) hours as needed.    [provider]  dibucaine (NUPERCAINAL) 1 % OINT Place 1 application rectally as needed for hemorrhoids. 10/16/19   Gustavo Lah, CNM  ferrous sulfate 325 (65 FE) MG tablet Take 1 tablet (325 mg total) by mouth 2 (two) times daily with a meal. 10/16/19   Gustavo Lah, CNM  pantoprazole (PROTONIX) 40 MG tablet Take 40 mg by mouth daily.     [provider]  Prenatal Vit-Fe Fumarate-FA (PRENATAL MULTIVITAMIN) TABS tablet Take 1 tablet by mouth daily at 12 noon.    [provider]  senna-docusate (SENOKOT-S) 8.6-50 MG tablet Take 2 tablets by mouth daily as needed for mild constipation. 10/16/19   Gustavo Lah, CNM  witch hazel-glycerin (TUCKS) pad Apply 1 application topically as needed for hemorrhoids. 10/16/19   Gustavo Lah, CNM    Allergies Latex, Sulfa antibiotics, and Penicillins  Family History  Problem Relation Age of Onset   Diabetes Paternal Grandmother    Asthma Paternal Grandmother    Migraines Paternal Grandmother    Breast cancer Neg Hx    Ovarian cancer Neg Hx    Colon cancer Neg Hx     Social History Social History   Tobacco Use   Smoking status: Every Day    Packs/day: 0.50    Pack years: 0.00    Types: Cigarettes   Smokeless tobacco: Never  Vaping Use   Vaping Use: Never used  Substance Use Topics   Alcohol use: Not Currently   Drug use: Not Currently    Types: Marijuana    Comment: not currently    Review of Systems  Constitutional: Negative for fever. Cardiovascular: Negative for chest pain. Respiratory: Negative for shortness of breath. Gastrointestinal: Negative for abdominal pain, vomiting and diarrhea. Genitourinary: Negative for dysuria. Musculoskeletal: Negative for back pain. Skin: Negative for rash.  Right breast abscess as above. Neurological: Negative for headaches, focal weakness or numbness. ____________________________________________  PHYSICAL EXAM:  VITAL SIGNS: ED Triage Vitals  Enc Vitals Group     BP 10/17/20 1230 121/74     Pulse Rate 10/17/20 1230 (!) 105     Resp 10/17/20 1230 17     Temp 10/17/20 1230 98.5 F (36.9 C)     Temp Source 10/17/20 1230 Oral     SpO2 10/17/20 1230 100 %     Weight 10/17/20 1231 185 lb 3 oz (84 kg)     Height 10/17/20 1231 5' (1.524 m)     Head Circumference --      Peak Flow --      Pain Score  10/17/20 1230 10     Pain Loc --      Pain Edu? --      Excl. in GC? --     Constitutional: Alert and oriented. Well appearing and in no distress. Head: Normocephalic and atraumatic. Eyes: Conjunctivae are normal. Normal extraocular movements Hematological/Lymphatic/Immunological: No cervical, supra-/infraclavicular lymphadenopathy. Cardiovascular: Normal rate, regular rhythm. Normal distal pulses. Respiratory: Normal respiratory effort. No wheezes/rales/rhonchi. Musculoskeletal: Nontender with normal range of motion in all extremities.  Neurologic:  Normal gait without ataxia. Normal speech and language. No gross focal neurologic deficits are appreciated. Skin:  Skin is warm, dry and intact. No rash noted.  Right breast with significant area of firmness to the inferior nipple areolar region, at approximately the 6 o'clock position.  The area of induration appears to be about 3 to 4 cm in diameter.  No nipple retraction or nipple discharge on manipulation.  No peau d'orange skin appearance and no puckering is noted. Psychiatric: Mood and affect are normal. Patient exhibits appropriate insight and judgment. ____________________________________________   LABS (pertinent positives/negatives)  Labs Reviewed  BASIC METABOLIC PANEL - Abnormal; Notable for the following components:      Result Value   Creatinine, Ser 0.39 (*)    All other components within normal limits  CBC WITH DIFFERENTIAL/PLATELET - Abnormal; Notable for the following components:   WBC 14.6 (*)    RBC 3.67 (*)    Hemoglobin 11.3 (*)    HCT 32.7 (*)    Neutro Abs 10.4 (*)    Monocytes Absolute 1.2 (*)    All other components within normal limits  ____________________________________________   RADIOLOGY  [place] Right Breast   IMPRESSION: There is a 3 cm RIGHT retroareolar abscess. Ultrasound-guided abscess aspiration will be attempted same day.   RECOMMENDATION: RIGHT breast ultrasound guided aspiration   I have  discussed the findings and recommendations with the patient. If applicable, a reminder letter will be sent to the patient regarding the next appointment.   BI-RADS CATEGORY  2: Benign. ____________________________________________  PROCEDURES  US Guided Abscess Drainage  IMPRESSION: Ultrasound-guided aspiration of a RIGHT breast abscess. No apparent complications.   RECOMMENDATIONS: Pending laboratory results. Recommend antibiotic treatment and clinical follow-up to resolution.   These results were called by telephone at the time of interpretation on 10/17/2020 at 5:05 pm to provider Nike Southers , who verbally acknowledged these results.  Procedures  Clindamycin 300 mg PO ____________________________________________   INITIAL IMPRESSION / ASSESSMENT AND PLAN / ED COURSE  As part of my medical decision making, I reviewed the following data within the electronic MEDICAL RECORD NUMBER Labs reviewed as above, Old chart reviewed, Discussed with radiologist, and Notes from prior ED visits   Patient ED evaluation of a right breast abscess that has been persistent and worsening over the last several days.  She was evaluated by ultrasound,  and found to have a focal abscess.  Radiologist contacted the patient and she proceeded with a needle aspiration.   Virginia Mitchell was evaluated in Emergency Department on 10/24/2020 for the symptoms described in the history of present illness. She was evaluated in the context of the global COVID-19 pandemic, which necessitated consideration that the patient might be at risk for infection with the SARS-CoV-2 virus that causes COVID-19. Institutional protocols and algorithms that pertain to the evaluation of patients at risk for COVID-19 are in a state of rapid change based on information released by regulatory bodies including the CDC and federal and state organizations. These policies and algorithms were followed during the patient's care in the  ED. ____________________________________________  FINAL CLINICAL IMPRESSION(S) / ED DIAGNOSES  Final diagnoses:  Abscess  Breast abscess during pregnancy, antepartum      Lissa Hoard, PA-C 10/24/20 1211    Sharman Cheek, MD 10/30/20 [phone]

## 2020-10-19 LAB — BODY FLUID CULTURE W GRAM STAIN

## 2020-11-02 DIAGNOSIS — Z419 Encounter for procedure for purposes other than remedying health state, unspecified: Secondary | ICD-10-CM | POA: Diagnosis not present

## 2020-11-19 ENCOUNTER — Emergency Department
Admission: EM | Admit: 2020-11-19 | Discharge: 2020-11-20 | Disposition: A | Discharge status: Home / Self Care | Payer: Medicaid Other | Attending: Emergency Medicine | Admitting: Emergency Medicine

## 2020-11-19 ENCOUNTER — Emergency Department: Payer: Medicaid Other

## 2020-11-19 ENCOUNTER — Other Ambulatory Visit: Payer: Self-pay

## 2020-11-19 DIAGNOSIS — O99333 Smoking (tobacco) complicating pregnancy, third trimester: Secondary | ICD-10-CM | POA: Insufficient documentation

## 2020-11-19 DIAGNOSIS — O91113 Abscess of breast associated with pregnancy, third trimester: Secondary | ICD-10-CM | POA: Diagnosis not present

## 2020-11-19 DIAGNOSIS — Z91040 Latex allergy status: Secondary | ICD-10-CM | POA: Diagnosis not present

## 2020-11-19 DIAGNOSIS — N611 Abscess of the breast and nipple: Secondary | ICD-10-CM | POA: Diagnosis not present

## 2020-11-19 DIAGNOSIS — L0291 Cutaneous abscess, unspecified: Secondary | ICD-10-CM

## 2020-11-19 DIAGNOSIS — Z23 Encounter for immunization: Secondary | ICD-10-CM | POA: Diagnosis not present

## 2020-11-19 DIAGNOSIS — Z3A28 28 weeks gestation of pregnancy: Secondary | ICD-10-CM | POA: Insufficient documentation

## 2020-11-19 DIAGNOSIS — O91119 Abscess of breast associated with pregnancy, unspecified trimester: Secondary | ICD-10-CM

## 2020-11-19 DIAGNOSIS — F17210 Nicotine dependence, cigarettes, uncomplicated: Secondary | ICD-10-CM | POA: Insufficient documentation

## 2020-11-19 LAB — CBC WITH DIFFERENTIAL/PLATELET
Abs Immature Granulocytes: 0.12 10*3/uL — ABNORMAL HIGH (ref 0.00–0.07)
Basophils Absolute: 0.1 10*3/uL (ref 0.0–0.1)
Basophils Relative: 0 %
Eosinophils Absolute: 0.2 10*3/uL (ref 0.0–0.5)
Eosinophils Relative: 2 %
HCT: 34.4 % — ABNORMAL LOW (ref 36.0–46.0)
Hemoglobin: 11.4 g/dL — ABNORMAL LOW (ref 12.0–15.0)
Immature Granulocytes: 1 %
Lymphocytes Relative: 25 %
Lymphs Abs: 3.4 10*3/uL (ref 0.7–4.0)
MCH: 30.6 pg (ref 26.0–34.0)
MCHC: 33.1 g/dL (ref 30.0–36.0)
MCV: 92.5 fL (ref 80.0–100.0)
Monocytes Absolute: 0.9 10*3/uL (ref 0.1–1.0)
Monocytes Relative: 6 %
Neutro Abs: 9.3 10*3/uL — ABNORMAL HIGH (ref 1.7–7.7)
Neutrophils Relative %: 66 %
Platelets: 436 10*3/uL — ABNORMAL HIGH (ref 150–400)
RBC: 3.72 MIL/uL — ABNORMAL LOW (ref 3.87–5.11)
RDW: 13 % (ref 11.5–15.5)
WBC: 14 10*3/uL — ABNORMAL HIGH (ref 4.0–10.5)
nRBC: 0 % (ref 0.0–0.2)

## 2020-11-19 LAB — COMPREHENSIVE METABOLIC PANEL
ALT: 12 U/L (ref 0–44)
AST: 15 U/L (ref 15–41)
Albumin: 3.2 g/dL — ABNORMAL LOW (ref 3.5–5.0)
Alkaline Phosphatase: 100 U/L (ref 38–126)
Anion gap: 7 (ref 5–15)
BUN: 6 mg/dL (ref 6–20)
CO2: 24 mmol/L (ref 22–32)
Calcium: 8.9 mg/dL (ref 8.9–10.3)
Chloride: 106 mmol/L (ref 98–111)
Creatinine, Ser: 0.44 mg/dL (ref 0.44–1.00)
GFR, Estimated: >60 mL/min (ref >60)
Glucose, Bld: 105 mg/dL — ABNORMAL HIGH (ref 70–99)
Potassium: 3.2 mmol/L — ABNORMAL LOW (ref 3.5–5.1)
Sodium: 137 mmol/L (ref 135–145)
Total Bilirubin: 0.4 mg/dL (ref 0.3–1.2)
Total Protein: 7.1 g/dL (ref 6.5–8.1)

## 2020-11-19 NOTE — ED Triage Notes (Signed)
Pt with abscess under right nipple states has had it for over a year. Has been on antibiotics for the same but cannot get it to completely heal. Small area of redness noted with small open area.

## 2020-11-19 NOTE — ED Notes (Signed)
US at bedside

## 2020-11-19 NOTE — ED Notes (Signed)
When going into pt's room to assess, pt is nowhere to be found. Provider notified and aware.

## 2020-11-19 NOTE — ED Provider Notes (Signed)
[facility] Emergency Department Provider Note ____________________________________________   Event Date/Time   First MD Initiated Contact with Patient 11/19/20 2058     (approximate)  I have reviewed the triage vital signs and the nursing notes.  HISTORY  Chief Complaint Abscess   HPI Virginia Mitchell is a 30 y.o. femalewho presents to the ED for evaluation of breast abscess  Chart review indicates history of obesity.  Patient is currently pregnant at [redacted] weeks gestation. Has seen Dr. Everlene Farrier with gen surg, regarding possible breast abscess.  Plan to follow-up postpartum.  Patient presents to ED for evaluation of recurrent abscess beneath the nipple of her right breast.  She reports recurrent symptoms over the past couple months.  She reports this area "popping" earlier today, draining purulent material and feeling better.  Denies fevers or systemic symptoms.   Past Medical History:  Diagnosis Date   Anemia     Patient Active Problem List   Diagnosis Date Noted   Breast abscess 03/07/2020   Limited prenatal care in third trimester 10/13/2019   Pregnancy 09/03/2019   Abdominal pain affecting pregnancy 08/08/2019    Past Surgical History:  Procedure Laterality Date   DILATION AND CURETTAGE OF UTERUS      Prior to Admission medications   Medication Sig Start Date End Date Taking? Authorizing Provider  acetaminophen (TYLENOL) 500 MG tablet Take 500 mg by mouth every 6 (six) hours as needed.    [provider]  dibucaine (NUPERCAINAL) 1 % OINT Place 1 application rectally as needed for hemorrhoids. 10/16/19   Gustavo Lah, CNM  ferrous sulfate 325 (65 FE) MG tablet Take 1 tablet (325 mg total) by mouth 2 (two) times daily with a meal. 10/16/19   Gustavo Lah, CNM  pantoprazole (PROTONIX) 40 MG tablet Take 40 mg by mouth daily.    [provider]  Prenatal Vit-Fe Fumarate-FA (PRENATAL MULTIVITAMIN) TABS tablet Take 1 tablet by  mouth daily at 12 noon.    [provider]  senna-docusate (SENOKOT-S) 8.6-50 MG tablet Take 2 tablets by mouth daily as needed for mild constipation. 10/16/19   Gustavo Lah, CNM  witch hazel-glycerin (TUCKS) pad Apply 1 application topically as needed for hemorrhoids. 10/16/19   Gustavo Lah, CNM    Allergies Latex, Sulfa antibiotics, and Penicillins  Family History  Problem Relation Age of Onset   Diabetes Paternal Grandmother    Asthma Paternal Grandmother    Migraines Paternal Grandmother    Breast cancer Neg Hx    Ovarian cancer Neg Hx    Colon cancer Neg Hx     Social History Social History   Tobacco Use   Smoking status: Every Day    Packs/day: 0.50    Types: Cigarettes   Smokeless tobacco: Never  Vaping Use   Vaping Use: Never used  Substance Use Topics   Alcohol use: Not Currently   Drug use: Not Currently    Types: Marijuana    Comment: not currently    Review of Systems  Constitutional: No fever/chills Eyes: No visual changes. ENT: No sore throat. Cardiovascular: Denies chest pain. Respiratory: Denies shortness of breath. Gastrointestinal: No abdominal pain.  No nausea, no vomiting.  No diarrhea.  No constipation. Genitourinary: Negative for dysuria. Musculoskeletal: Negative for back pain. Skin: Negative for rash. Neurological: Negative for headaches, focal weakness or numbness.  ____________________________________________   PHYSICAL EXAM:  VITAL SIGNS: Vitals:   11/19/20 2036  BP: 125/75  Pulse: Marland Kitchen)  102  Resp: 20  Temp: 98.4 F (36.9 C)  SpO2: 100%      Constitutional: Alert and oriented. Well appearing and in no acute distress. Eyes: Conjunctivae are normal. PERRL. EOMI. Head: Atraumatic. Nose: No congestion/rhinnorhea. Mouth/Throat: Mucous membranes are moist.  Oropharynx non-erythematous. Neck: No stridor. No cervical spine tenderness to palpation. Cardiovascular: Normal rate, regular rhythm. Grossly normal heart  sounds.  Good peripheral circulation. Respiratory: Normal respiratory effort.  No retractions. Lungs CTAB. Gastrointestinal: Soft , nondistended, nontender to palpation. No CVA tenderness. Musculoskeletal: No lower extremity tenderness nor edema.  No joint effusions. No signs of acute trauma. Neurologic:  Normal speech and language. No gross focal neurologic deficits are appreciated. No gait instability noted. Skin:  Skin is warm, dry and intact. No rash noted.  Tiny, 1-2 mm, open wound to the 7 o'clock position of the margin of her right areola.  Hemostatic without surrounding induration or erythema.  No discharge from the nipple.  Able to express a tiny amount of bloody discharge without continued purulence.  Psychiatric: Mood and affect are normal. Speech and behavior are normal.  ____________________________________________   LABS (all labs ordered are listed, but only abnormal results are displayed)  Labs Reviewed  CBC WITH DIFFERENTIAL/PLATELET - Abnormal; Notable for the following components:      Result Value   WBC 14.0 (*)    RBC 3.72 (*)    Hemoglobin 11.4 (*)    HCT 34.4 (*)    Platelets 436 (*)    Neutro Abs 9.3 (*)    Abs Immature Granulocytes 0.12 (*)    All other components within normal limits  COMPREHENSIVE METABOLIC PANEL - Abnormal; Notable for the following components:   Potassium 3.2 (*)    Glucose, Bld 105 (*)    Albumin 3.2 (*)    All other components within normal limits   ____________________________________________  12 Lead EKG   ____________________________________________  RADIOLOGY  ED MD interpretation:    Official radiology report(s): No results found.  ____________________________________________   PROCEDURES and INTERVENTIONS  Procedure(s) performed (including Critical Care):  Procedures  Medications - No data to display  ____________________________________________   MDM / ED COURSE   30 year old female at [redacted] weeks  gestation presents with recurrence of right-sided breast abscess, already draining, and amenable to outpatient management with antibiotics.  She was clinically well with already-draining area to the 7 o'clock position of her areola.  Ultrasound with complex heterogenous fluid collection concerning for persistent abscess despite her wound already draining.  Considering how she looks clinically and that she is already established with surgery, I think it be reasonable to discharge with antibiotics and close outpatient follow-up.  Unfortunately she eloped from the emergency department before we can discuss this.  She has been called back to the ED and will hopefully return for education and prescription for antibiotics, clindamycin would be reasonable.  Clinical Course as of 11/20/20 0006  Mon Nov 19, 2020  2331 Went to reassess after ultrasound.  Patient is not in the room. [DS]  Tue Nov 20, 2020  0004 Nursing called the patient and she said that she is on her way back. [DS]    Clinical Course User Index [DS] Delton Prairie, MD    ____________________________________________   FINAL CLINICAL IMPRESSION(S) / ED DIAGNOSES  Final diagnoses:  Abscess  Breast abscess during pregnancy, antepartum     ED Discharge Orders     None        Harvin Konicek   Note:  This document was prepared using Dragon voice recognition software and may include unintentional dictation errors.    Delton Prairie, MD 11/20/20 [phone]

## 2020-11-19 NOTE — ED Notes (Signed)
Pt found to have left without discharge and with IV in arm. Pt called by Clinical research associate with answer. Pt reports she put the IV in the sharpe's container. Writer addresses with pt that we are unable to see IV and that she will need to come in to ED to have first RN view. Pt laughed at Clinical research associate and sts, "Well it will be about 30 minutes [place]." First RN notified of pt return.

## 2020-11-20 NOTE — ED Notes (Signed)
Pt back to ED to confirm that IV removed from arm.

## 2020-12-04 DIAGNOSIS — Z3483 Encounter for supervision of other normal pregnancy, third trimester: Secondary | ICD-10-CM | POA: Diagnosis not present

## 2020-12-26 ENCOUNTER — Other Ambulatory Visit: Payer: Self-pay

## 2020-12-26 ENCOUNTER — Emergency Department: Payer: Medicaid Other

## 2020-12-26 ENCOUNTER — Emergency Department
Admission: EM | Admit: 2020-12-26 | Discharge: 2020-12-26 | Disposition: A | Discharge status: Home / Self Care | Payer: Medicaid Other | Attending: Student in an Organized Health Care Education/Training Program | Admitting: Student in an Organized Health Care Education/Training Program

## 2020-12-26 DIAGNOSIS — Z91040 Latex allergy status: Secondary | ICD-10-CM | POA: Insufficient documentation

## 2020-12-26 DIAGNOSIS — O91113 Abscess of breast associated with pregnancy, third trimester: Secondary | ICD-10-CM | POA: Insufficient documentation

## 2020-12-26 DIAGNOSIS — F17210 Nicotine dependence, cigarettes, uncomplicated: Secondary | ICD-10-CM | POA: Insufficient documentation

## 2020-12-26 DIAGNOSIS — Z3A32 32 weeks gestation of pregnancy: Secondary | ICD-10-CM | POA: Insufficient documentation

## 2020-12-26 DIAGNOSIS — N611 Abscess of the breast and nipple: Secondary | ICD-10-CM

## 2020-12-26 LAB — COMPREHENSIVE METABOLIC PANEL
ALT: 11 U/L (ref 0–44)
AST: 15 U/L (ref 15–41)
Albumin: 3 g/dL — ABNORMAL LOW (ref 3.5–5.0)
Alkaline Phosphatase: 128 U/L — ABNORMAL HIGH (ref 38–126)
Anion gap: 6 (ref 5–15)
BUN: <5 mg/dL — ABNORMAL LOW (ref 6–20)
CO2: 23 mmol/L (ref 22–32)
Calcium: 8.8 mg/dL — ABNORMAL LOW (ref 8.9–10.3)
Chloride: 104 mmol/L (ref 98–111)
Creatinine, Ser: 0.37 mg/dL — ABNORMAL LOW (ref 0.44–1.00)
GFR, Estimated: >60 mL/min (ref >60)
Glucose, Bld: 106 mg/dL — ABNORMAL HIGH (ref 70–99)
Potassium: 3.8 mmol/L (ref 3.5–5.1)
Sodium: 133 mmol/L — ABNORMAL LOW (ref 135–145)
Total Bilirubin: 0.5 mg/dL (ref 0.3–1.2)
Total Protein: 6.6 g/dL (ref 6.5–8.1)

## 2020-12-26 LAB — CBC WITH DIFFERENTIAL/PLATELET
Abs Immature Granulocytes: 0.14 10*3/uL — ABNORMAL HIGH (ref 0.00–0.07)
Basophils Absolute: 0.1 10*3/uL (ref 0.0–0.1)
Basophils Relative: 0 %
Eosinophils Absolute: 0.2 10*3/uL (ref 0.0–0.5)
Eosinophils Relative: 2 %
HCT: 32.5 % — ABNORMAL LOW (ref 36.0–46.0)
Hemoglobin: 10.6 g/dL — ABNORMAL LOW (ref 12.0–15.0)
Immature Granulocytes: 1 %
Lymphocytes Relative: 21 %
Lymphs Abs: 2.8 10*3/uL (ref 0.7–4.0)
MCH: 29.4 pg (ref 26.0–34.0)
MCHC: 32.6 g/dL (ref 30.0–36.0)
MCV: 90.3 fL (ref 80.0–100.0)
Monocytes Absolute: 1 10*3/uL (ref 0.1–1.0)
Monocytes Relative: 8 %
Neutro Abs: 8.9 10*3/uL — ABNORMAL HIGH (ref 1.7–7.7)
Neutrophils Relative %: 68 %
Platelets: 366 10*3/uL (ref 150–400)
RBC: 3.6 MIL/uL — ABNORMAL LOW (ref 3.87–5.11)
RDW: 12.9 % (ref 11.5–15.5)
WBC: 13.1 10*3/uL — ABNORMAL HIGH (ref 4.0–10.5)
nRBC: 0 % (ref 0.0–0.2)

## 2020-12-26 MED ORDER — CLINDAMYCIN HCL 300 MG PO CAPS: 300.0000 mg | ORAL_CAPSULE | Freq: Three times a day (TID) | ORAL | 0 refills | Status: AC

## 2020-12-26 NOTE — ED Triage Notes (Addendum)
Pt comes pov [redacted] weeks pregnant with right breast abscess. States she has taken 3 courses of antibiotics for it and it has not gotten better. States this has been ongoing for over a year.

## 2020-12-26 NOTE — ED Provider Notes (Signed)
[facility] Emergency Department Provider Note  ____________________________________________   Event Date/Time   First MD Initiated Contact with Patient 12/26/20 1312     (approximate)  I have reviewed the triage vital signs and the nursing notes.   HISTORY  Chief Complaint Abscess   HPI Virginia Mitchell is a 30 y.o. female who presents to the emergency department with concerns for right-sided breast abscess.  Patient states that she has had difficulty getting resolution of this chronic abscess.  She endorses taking 3 rounds of clindamycin and states that she has not had any improvement with the rounds.  She is [redacted] weeks pregnant, denies any abdominal pain, contractions, vaginal bleeding or leaking or concerns with the pregnancy today.  She denies any fevers or other illness symptoms.  States that the wound is continuing to drain as previous.         Past Medical History:  Diagnosis Date   Anemia     Patient Active Problem List   Diagnosis Date Noted   Breast abscess 03/07/2020   Limited prenatal care in third trimester 10/13/2019   Pregnancy 09/03/2019   Abdominal pain affecting pregnancy 08/08/2019    Past Surgical History:  Procedure Laterality Date   DILATION AND CURETTAGE OF UTERUS      Prior to Admission medications   Medication Sig Start Date End Date Taking? Authorizing Provider  clindamycin (CLEOCIN) 300 MG capsule Take 1 capsule (300 mg total) by mouth 3 (three) times daily for 10 days. 12/26/20 01/05/21 Yes Lucy Chris, PA  acetaminophen (TYLENOL) 500 MG tablet Take 500 mg by mouth every 6 (six) hours as needed.    [provider]  dibucaine (NUPERCAINAL) 1 % OINT Place 1 application rectally as needed for hemorrhoids. 10/16/19   Gustavo Lah, CNM  ferrous sulfate 325 (65 FE) MG tablet Take 1 tablet (325 mg total) by mouth 2 (two) times daily with a meal. 10/16/19   Gustavo Lah, CNM  pantoprazole (PROTONIX) 40 MG  tablet Take 40 mg by mouth daily.    [provider]  Prenatal Vit-Fe Fumarate-FA (PRENATAL MULTIVITAMIN) TABS tablet Take 1 tablet by mouth daily at 12 noon.    [provider]  senna-docusate (SENOKOT-S) 8.6-50 MG tablet Take 2 tablets by mouth daily as needed for mild constipation. 10/16/19   Gustavo Lah, CNM  witch hazel-glycerin (TUCKS) pad Apply 1 application topically as needed for hemorrhoids. 10/16/19   Gustavo Lah, CNM    Allergies Latex, Sulfa antibiotics, and Penicillins  Family History  Problem Relation Age of Onset   Diabetes Paternal Grandmother    Asthma Paternal Grandmother    Migraines Paternal Grandmother    Breast cancer Neg Hx    Ovarian cancer Neg Hx    Colon cancer Neg Hx     Social History Social History   Tobacco Use   Smoking status: Every Day    Packs/day: 0.50    Types: Cigarettes   Smokeless tobacco: Never  Vaping Use   Vaping Use: Never used  Substance Use Topics   Alcohol use: Not Currently   Drug use: Not Currently    Types: Marijuana    Comment: not currently    Review of Systems Constitutional: No fever/chills Eyes: No visual changes. ENT: No sore throat. Cardiovascular: Denies chest pain. Respiratory: Denies shortness of breath. Gastrointestinal: No abdominal pain.  No nausea, no vomiting.  No diarrhea.  No constipation. Genitourinary: Negative for dysuria. Musculoskeletal: Negative for  back pain. Skin: + Breast changes Neurological: Negative for headaches, focal weakness or numbness.   ____________________________________________   PHYSICAL EXAM:  VITAL SIGNS: ED Triage Vitals [12/26/20 1231]  Enc Vitals Group     BP 117/80     Pulse Rate 94     Resp 18     Temp 98.5 F (36.9 C)     Temp Source Oral     SpO2 100 %     Weight 196 lb (88.9 kg)     Height 5' (1.524 m)     Head Circumference      Peak Flow      Pain Score 0     Pain Loc      Pain Edu?      Excl. in GC?    Constitutional:  Alert and oriented. Well appearing and in no acute distress. Eyes: Conjunctivae are normal. PERRL. EOMI. Head: Atraumatic. Nose: No congestion/rhinnorhea. Mouth/Throat: Mucous membranes are moist.  Oropharynx non-erythematous. Neck: No stridor.   Cardiovascular: Normal rate, regular rhythm. Grossly normal heart sounds.  Good peripheral circulation. Respiratory: Normal respiratory effort.  No retractions. Lungs CTAB. Gastrointestinal: Abdomen consistent with 32-week gestation.  No tenderness. No abdominal bruits. No CVA tenderness. Musculoskeletal: No lower extremity tenderness nor edema.  No joint effusions. Neurologic:  Normal speech and language. No gross focal neurologic deficits are appreciated. No gait instability. Skin: There is a 1 cm x 1 cm open circular wound in the 7 o'clock position on the right breast.  There is expressible pustular discharge.  Very mild surrounding induration approximately half centimeter in each direction.  No surrounding erythema.  No discharge from the nipple itself. Psychiatric: Mood and affect are normal. Speech and behavior are normal.  ____________________________________________   LABS (all labs ordered are listed, but only abnormal results are displayed)  Labs Reviewed  CBC WITH DIFFERENTIAL/PLATELET - Abnormal; Notable for the following components:      Result Value   WBC 13.1 (*)    RBC 3.60 (*)    Hemoglobin 10.6 (*)    HCT 32.5 (*)    Neutro Abs 8.9 (*)    Abs Immature Granulocytes 0.14 (*)    All other components within normal limits  COMPREHENSIVE METABOLIC PANEL - Abnormal; Notable for the following components:   Sodium 133 (*)    Glucose, Bld 106 (*)    BUN <5 (*)    Creatinine, Ser 0.37 (*)    Calcium 8.8 (*)    Albumin 3.0 (*)    Alkaline Phosphatase 128 (*)    All other components within normal limits    ____________________________________________  RADIOLOGY  Breast ultrasound reviewed, reported improvement in size of  abscess as compared to previous.  Breast center provider recommended to patient improved wound care as well as antibiotics.   ____________________________________________   INITIAL IMPRESSION / ASSESSMENT AND PLAN / ED COURSE  As part of my medical decision making, I reviewed the following data within the electronic MEDICAL RECORD NUMBER Nursing notes reviewed and incorporated, Labs reviewed, and Notes from prior ED visits        Patient is a 30 year old female who reports to the emergency department for evaluation of right-sided breast abscess that has been persistent for several months during this patient's pregnancy.  See HPI for further details.  In triage patient has grossly normal vital signs.  Physical exam as above.  Labs were obtained which demonstrates some chronic mild hemoglobin anemia of pregnancy as well as mild leukocytosis.  Ultrasound was obtained and demonstrates an improving abscess, though still present with open skin wound.  Breast center provider recommended close wound center follow-up as well antibiotics.  The patient's allergies were reviewed, and she has a documented history of penicillin allergy, is unsure what her reaction to this was.  She denies any history of knowingly taking any amoxicillin, Augmentin or cephalosporins, so unsure of cross-reactivity.  Patient also has documented history of allergy to Bactrim.  Thus, remaining antibiotic continues to be clindamycin at this time given not wanting to cause possible anaphylaxis during pregnancy.  Case was discussed with attending Dr. Roxan Hockey who agrees benefits outweigh risk of repeat dosing of clindamycin.  Patient is amenable with this plan, she was referred to the wound care clinic and was also encouraged to talk about these visits with her OB/GYN in preparation for wanting to breast-feed.  Patient return precautions were discussed, she stable this time for outpatient follow-up.       ____________________________________________   FINAL CLINICAL IMPRESSION(S) / ED DIAGNOSES  Final diagnoses:  Breast abscess     ED Discharge Orders          Ordered    clindamycin (CLEOCIN) 300 MG capsule  3 times daily        12/26/20 1648             Note:  This document was prepared using Dragon voice recognition software and may include unintentional dictation errors.    Lucy Chris, PA 12/26/20 Lanny Cramp, MD 12/26/20 [phone]

## 2020-12-26 NOTE — ED Notes (Signed)
See triage note  Presents with possible abscess area to right breast area

## 2020-12-26 NOTE — Discharge Instructions (Addendum)
Please take clindamycin as prescribed.  Keep the wound clean, dry and covered.  Please call the wound care center tomorrow with her number above to schedule an appointment as soon as possible.  Please also call your OB provider tomorrow to inform them of your visit and get any additional recommendations.  Return to the emergency department if you experience any fevers or other worsening.

## 2020-12-27 ENCOUNTER — Telehealth: Payer: Self-pay

## 2020-12-27 NOTE — Telephone Encounter (Signed)
Transition Care Management Follow-up Telephone Call Date of discharge and from where: 12/26/2020-[facility] How have you been since you were released from the hospital? Patient stated she is doing fine.  Any questions or concerns? No  Items Reviewed: Did the pt receive and understand the discharge instructions provided? Yes  Medications obtained and verified? Yes  Other? No  Any new allergies since your discharge? No  Dietary orders reviewed? N/A Do you have support at home? Yes   Home Care and Equipment/Supplies: Were home health services ordered? not applicable If so, what is the name of the agency? N/A  Has the agency set up a time to come to the patient's home? not applicable Were any new equipment or medical supplies ordered?  No What is the name of the medical supply agency? N/A Were you able to get the supplies/equipment? not applicable Do you have any questions related to the use of the equipment or supplies? No  Functional Questionnaire: (I = Independent and D = Dependent) ADLs: I  Bathing/Dressing- I  Meal Prep- I  Eating- I  Maintaining continence- I  Transferring/Ambulation- I  Managing Meds- I  Follow up appointments reviewed:  PCP Hospital f/u appt confirmed? No   Specialist Hospital f/u appt confirmed? No   Are transportation arrangements needed? No  If their condition worsens, is the pt aware to call PCP or go to the Emergency Dept.? Yes Was the patient provided with contact information for the PCP's office or ED? Yes Was to pt encouraged to call back with questions or concerns? Yes

## 2021-01-01 ENCOUNTER — Ambulatory Visit: Payer: Medicaid Other | Admitting: Physician Assistant

## 2021-01-02 DIAGNOSIS — O91013 Infection of nipple associated with pregnancy, third trimester: Secondary | ICD-10-CM | POA: Diagnosis not present

## 2021-01-08 ENCOUNTER — Observation Stay
Admission: EM | Admit: 2021-01-08 | Discharge: 2021-01-08 | Disposition: A | Discharge status: Home / Self Care | Payer: Medicaid Other

## 2021-01-08 ENCOUNTER — Encounter: Payer: Self-pay | Admitting: Obstetrics and Gynecology

## 2021-01-08 ENCOUNTER — Other Ambulatory Visit: Payer: Self-pay

## 2021-01-08 DIAGNOSIS — B069 Rubella without complication: Secondary | ICD-10-CM | POA: Diagnosis not present

## 2021-01-08 DIAGNOSIS — F17210 Nicotine dependence, cigarettes, uncomplicated: Secondary | ICD-10-CM | POA: Diagnosis not present

## 2021-01-08 DIAGNOSIS — Z3A35 35 weeks gestation of pregnancy: Secondary | ICD-10-CM | POA: Diagnosis not present

## 2021-01-08 DIAGNOSIS — O09893 Supervision of other high risk pregnancies, third trimester: Secondary | ICD-10-CM | POA: Insufficient documentation

## 2021-01-08 DIAGNOSIS — O99213 Obesity complicating pregnancy, third trimester: Secondary | ICD-10-CM | POA: Diagnosis not present

## 2021-01-08 DIAGNOSIS — O4700 False labor before 37 completed weeks of gestation, unspecified trimester: Secondary | ICD-10-CM | POA: Diagnosis present

## 2021-01-08 DIAGNOSIS — O368330 Maternal care for abnormalities of the fetal heart rate or rhythm, third trimester, not applicable or unspecified: Secondary | ICD-10-CM | POA: Diagnosis not present

## 2021-01-08 DIAGNOSIS — O4703 False labor before 37 completed weeks of gestation, third trimester: Principal | ICD-10-CM | POA: Insufficient documentation

## 2021-01-08 DIAGNOSIS — E669 Obesity, unspecified: Secondary | ICD-10-CM | POA: Insufficient documentation

## 2021-01-08 DIAGNOSIS — Z91040 Latex allergy status: Secondary | ICD-10-CM | POA: Insufficient documentation

## 2021-01-08 DIAGNOSIS — O99333 Smoking (tobacco) complicating pregnancy, third trimester: Secondary | ICD-10-CM | POA: Diagnosis not present

## 2021-01-08 DIAGNOSIS — O99013 Anemia complicating pregnancy, third trimester: Secondary | ICD-10-CM | POA: Diagnosis not present

## 2021-01-08 LAB — URINALYSIS, COMPLETE (UACMP) WITH MICROSCOPIC
Bacteria, UA: NONE SEEN
Bilirubin Urine: NEGATIVE
Glucose, UA: NEGATIVE mg/dL
Hgb urine dipstick: NEGATIVE
Nitrite: NEGATIVE
Specific Gravity, Urine: 1.025 (ref 1.005–1.030)
pH: 7.5 (ref 5.0–8.0)

## 2021-01-08 MED ORDER — ACETAMINOPHEN 500 MG PO TABS: 1000.0000 mg | ORAL_TABLET | Freq: Four times a day (QID) | ORAL | Status: DC | PRN

## 2021-01-08 NOTE — Discharge Summary (Signed)
Virginia Mitchell is a 30 y.o. female. She is at [redacted]w[redacted]d gestation. Patient's last menstrual period was 04/30/2020. Estimated Date of Delivery: 02/08/21  Prenatal care site: [facility]  Chief complaint: contractions  HPI: Virginia Mitchell presents to L&D with complaints of contractions that started around 2200, occurring every 4-8 minutes.  Denies LOF or vaginal bleeding.  Endorses good fetal movement.   Factors complicating pregnancy: Maternal iron deficiency anemia in pregnancy  Family history of Alpha thalassemia carrier  Obesity in pregnancy  Close interval pregnancies  Rubella non-immune   S: Resting comfortably. Irregular, mild no VB.no LOF,  Active fetal movement.   Maternal Medical History:  Past Medical Hx:  has a past medical history of Anemia.    Past Surgical Hx:  has a past surgical history that includes Dilation and curettage of uterus.   Allergies  Allergen Reactions   Latex Hives   Sulfa Antibiotics Shortness Of Breath   Penicillins Other (See Comments)    Reaction when little.      Prior to Admission medications   Medication Sig Start Date End Date Taking? Authorizing Provider  acetaminophen (TYLENOL) 500 MG tablet Take 500 mg by mouth every 6 (six) hours as needed.    [provider]  dibucaine (NUPERCAINAL) 1 % OINT Place 1 application rectally as needed for hemorrhoids. 10/16/19   Gustavo Lah, CNM  ferrous sulfate 325 (65 FE) MG tablet Take 1 tablet (325 mg total) by mouth 2 (two) times daily with a meal. 10/16/19   Gustavo Lah, CNM  pantoprazole (PROTONIX) 40 MG tablet Take 40 mg by mouth daily.    [provider]  Prenatal Vit-Fe Fumarate-FA (PRENATAL MULTIVITAMIN) TABS tablet Take 1 tablet by mouth daily at 12 noon.    [provider]  senna-docusate (SENOKOT-S) 8.6-50 MG tablet Take 2 tablets by mouth daily as needed for mild constipation. 10/16/19   Gustavo Lah, CNM  witch hazel-glycerin (TUCKS) pad Apply 1  application topically as needed for hemorrhoids. 10/16/19   Gustavo Lah, CNM    Social History: She  reports that she has been smoking cigarettes. She has been smoking an average of .5 packs per day. She has never used smokeless tobacco. She reports that she does not currently use alcohol. She reports that she does not currently use drugs after having used the following drugs: Marijuana.  Family History: family history includes Asthma in her paternal grandmother; Diabetes in her paternal grandmother; Migraines in her paternal grandmother.   Review of Systems: A full review of systems was performed and negative except as noted in the HPI.    O:  BP 113/67 (BP Location: Left Arm)   Pulse (!) 105   Temp 98.3 F (36.8 C) (Oral)   Resp 16   LMP 04/30/2020 Comment: [redacted] weeks pregnant  SpO2 95%  Results for orders placed or performed during the hospital encounter of 01/08/21 (from the past 48 hour(s))  Urinalysis, Complete w Microscopic Urine, Clean Catch   Collection Time: 01/08/21  1:42 AM  Result Value Ref Range   Color, Urine YELLOW YELLOW   APPearance CLEAR CLEAR   Specific Gravity, Urine 1.025 1.005 - 1.030   pH 7.5 5.0 - 8.0   Glucose, UA NEGATIVE NEGATIVE mg/dL   Hgb urine dipstick NEGATIVE NEGATIVE   Bilirubin Urine NEGATIVE NEGATIVE   Ketones, ur TRACE (A) NEGATIVE mg/dL   Protein, ur TRACE (A) NEGATIVE mg/dL   Nitrite NEGATIVE NEGATIVE   Leukocytes,Ua LARGE (A)  NEGATIVE   RBC / HPF 0-5 0 - 5 RBC/hpf   WBC, UA 6-10 0 - 5 WBC/hpf   Bacteria, UA NONE SEEN NONE SEEN   Squamous Epithelial / LPF 11-20 0 - 5   Mucus PRESENT       Constitutional: NAD, AAOx3  HE/ENT: extraocular movements grossly intact, moist mucous membranes CV: RRR PULM: nl respiratory effort, CTABL Abd: gravid, non-tender, non-distended, soft  Ext: Non-tender, Nonedmeatous Psych: mood appropriate, speech normal SVE: Dilation: 1 Effacement (%): Thick Station: -3 Exam by:: K Allred RN   Fetal  Monitor: Baseline: 145 bpm Variability: moderate Accels: Present Decels: variable x 1 when first placed on monitor - no interventions required, no further decels noted  Toco: initially every 2-4 hours but spaced out to irregular after rest and hydration   Category: I after initial variable at beginning of tracing    Assessment: 30 y.o. [redacted]w[redacted]d here for antenatal surveillance during pregnancy.  Principle diagnosis: preterm contractions    Plan: Labor: not present.  Fetal Wellbeing: Reassuring Cat 1 tracing. Reactive NST  Urine culture pending  Pelvic rest for the next 5-7 days  PTL warning signs reviewed D/c home stable, precautions reviewed, follow-up as scheduled.   ----- Margaretmary Eddy, CNM Certified Nurse Midwife [facility] [address]

## 2021-01-08 NOTE — OB Triage Note (Signed)
Pt discharged home in stable condition. RN provided discharge instructions to pt, including information about labor precautions/when to come back. Pt verbalized understanding and all questions answered at this time.

## 2021-01-08 NOTE — Discharge Instructions (Signed)

## 2021-01-08 NOTE — OB Triage Note (Signed)
[facility] with complaints of ctx. Pt states ctx began around 2200 and are around 4-8 min a part.  Pt denies vag bleeding and LOF. Pt states positive FM. Monitors applied and assessing. Initial FHT 145.

## 2021-01-09 ENCOUNTER — Telehealth: Payer: Self-pay

## 2021-01-09 LAB — URINE CULTURE

## 2021-01-09 NOTE — Telephone Encounter (Signed)
Transition Care Management Unsuccessful Follow-up Telephone Call  Date of discharge and from where:  01/08/2021 from [facility]  Attempts:  1st Attempt  Reason for unsuccessful TCM follow-up call:  Left voice message

## 2021-01-10 NOTE — Telephone Encounter (Signed)
Transition Care Management Follow-up Telephone Call Date of discharge and from where: 01/08/2021 [facility] ED How have you been since you were released from the hospital? "Doing okay" Any questions or concerns? No  Items Reviewed: Did the pt receive and understand the discharge instructions provided? Yes  Medications obtained and verified?  N/A Other? No  Any new allergies since your discharge? No  Dietary orders reviewed? No Do you have support at home? Yes    Functional Questionnaire: (I = Independent and D = Dependent) ADLs: I  Bathing/Dressing- I  Meal Prep- I  Eating- I  Maintaining continence- I  Transferring/Ambulation- I  Managing Meds- I  Follow up appointments reviewed:  PCP Hospital f/u appt confirmed? No   Specialist Hospital f/u appt confirmed? No   Are transportation arrangements needed? No  If their condition worsens, is the pt aware to call PCP or go to the Emergency Dept.? Yes Was the patient provided with contact information for the PCP's office or ED? Yes Was to pt encouraged to call back with questions or concerns? Yes

## 2021-01-14 DIAGNOSIS — O99213 Obesity complicating pregnancy, third trimester: Secondary | ICD-10-CM | POA: Diagnosis not present

## 2021-01-14 DIAGNOSIS — Z3483 Encounter for supervision of other normal pregnancy, third trimester: Secondary | ICD-10-CM | POA: Diagnosis not present

## 2021-01-14 LAB — OB RESULTS CONSOLE GBS: GBS: POSITIVE

## 2021-01-14 LAB — OB RESULTS CONSOLE HIV ANTIBODY (ROUTINE TESTING): HIV: NONREACTIVE

## 2021-01-14 LAB — OB RESULTS CONSOLE RPR: RPR: NONREACTIVE

## 2021-01-14 LAB — OB RESULTS CONSOLE GC/CHLAMYDIA
Chlamydia: NEGATIVE
Gonorrhea: NEGATIVE

## 2021-01-22 ENCOUNTER — Telehealth: Payer: Self-pay

## 2021-01-22 NOTE — Telephone Encounter (Signed)
..  Patient declines further follow up and engagement by the Managed Medicaid Team. Appropriate care team members and provider have been notified via electronic communication. The Managed Medicaid Team is available to follow up with the patient after provider conversation with the patient regarding recommendation for engagement and subsequent re-referral to the Managed Medicaid Team.    Jennifer Alley Care Guide, High Risk Medicaid Managed Care Embedded Care Coordination [place]  [facility]   

## 2021-01-24 ENCOUNTER — Observation Stay: Payer: Medicaid Other

## 2021-01-24 ENCOUNTER — Other Ambulatory Visit: Payer: Self-pay

## 2021-01-24 ENCOUNTER — Inpatient Hospital Stay
Admission: EM | Admit: 2021-01-24 | Discharge: 2021-01-27 | DRG: 806 | Disposition: A | Discharge status: Home / Self Care | Payer: Medicaid Other | Attending: Obstetrics and Gynecology | Admitting: Obstetrics and Gynecology

## 2021-01-24 ENCOUNTER — Encounter: Payer: Self-pay | Admitting: Obstetrics and Gynecology

## 2021-01-24 DIAGNOSIS — O99824 Streptococcus B carrier state complicating childbirth: Secondary | ICD-10-CM | POA: Diagnosis present

## 2021-01-24 DIAGNOSIS — Z880 Allergy status to penicillin: Secondary | ICD-10-CM

## 2021-01-24 DIAGNOSIS — Z3A37 37 weeks gestation of pregnancy: Secondary | ICD-10-CM

## 2021-01-24 DIAGNOSIS — Z23 Encounter for immunization: Secondary | ICD-10-CM

## 2021-01-24 DIAGNOSIS — O99214 Obesity complicating childbirth: Secondary | ICD-10-CM | POA: Diagnosis present

## 2021-01-24 DIAGNOSIS — Z20822 Contact with and (suspected) exposure to covid-19: Secondary | ICD-10-CM | POA: Diagnosis present

## 2021-01-24 DIAGNOSIS — D62 Acute posthemorrhagic anemia: Secondary | ICD-10-CM | POA: Diagnosis not present

## 2021-01-24 DIAGNOSIS — F17210 Nicotine dependence, cigarettes, uncomplicated: Secondary | ICD-10-CM | POA: Diagnosis present

## 2021-01-24 DIAGNOSIS — O9081 Anemia of the puerperium: Secondary | ICD-10-CM | POA: Diagnosis not present

## 2021-01-24 DIAGNOSIS — O99334 Smoking (tobacco) complicating childbirth: Secondary | ICD-10-CM | POA: Diagnosis present

## 2021-01-24 DIAGNOSIS — O368390 Maternal care for abnormalities of the fetal heart rate or rhythm, unspecified trimester, not applicable or unspecified: Secondary | ICD-10-CM | POA: Diagnosis present

## 2021-01-24 LAB — COMPREHENSIVE METABOLIC PANEL
ALT: 13 U/L (ref 0–44)
AST: 18 U/L (ref 15–41)
Albumin: 2.8 g/dL — ABNORMAL LOW (ref 3.5–5.0)
Alkaline Phosphatase: 154 U/L — ABNORMAL HIGH (ref 38–126)
Anion gap: 7 (ref 5–15)
BUN: 6 mg/dL (ref 6–20)
CO2: 21 mmol/L — ABNORMAL LOW (ref 22–32)
Calcium: 9 mg/dL (ref 8.9–10.3)
Chloride: 107 mmol/L (ref 98–111)
Creatinine, Ser: 0.48 mg/dL (ref 0.44–1.00)
GFR, Estimated: >60 mL/min (ref >60)
Glucose, Bld: 106 mg/dL — ABNORMAL HIGH (ref 70–99)
Potassium: 3.6 mmol/L (ref 3.5–5.1)
Sodium: 135 mmol/L (ref 135–145)
Total Bilirubin: 0.5 mg/dL (ref 0.3–1.2)
Total Protein: 6.5 g/dL (ref 6.5–8.1)

## 2021-01-24 LAB — URINALYSIS, ROUTINE W REFLEX MICROSCOPIC
Bilirubin Urine: NEGATIVE
Glucose, UA: NEGATIVE mg/dL
Hgb urine dipstick: NEGATIVE
Ketones, ur: NEGATIVE mg/dL
Nitrite: NEGATIVE
Protein, ur: NEGATIVE mg/dL
Specific Gravity, Urine: 1.019 (ref 1.005–1.030)
pH: 6 (ref 5.0–8.0)

## 2021-01-24 LAB — CBC
HCT: 30.3 % — ABNORMAL LOW (ref 36.0–46.0)
Hemoglobin: 10.1 g/dL — ABNORMAL LOW (ref 12.0–15.0)
MCH: 28.7 pg (ref 26.0–34.0)
MCHC: 33.3 g/dL (ref 30.0–36.0)
MCV: 86.1 fL (ref 80.0–100.0)
Platelets: 330 10*3/uL (ref 150–400)
RBC: 3.52 MIL/uL — ABNORMAL LOW (ref 3.87–5.11)
RDW: 13.3 % (ref 11.5–15.5)
WBC: 11.8 10*3/uL — ABNORMAL HIGH (ref 4.0–10.5)
nRBC: 0 % (ref 0.0–0.2)

## 2021-01-24 LAB — RESP PANEL BY RT-PCR (FLU A&B, COVID) ARPGX2
Influenza A by PCR: NEGATIVE
Influenza B by PCR: NEGATIVE
SARS Coronavirus 2 by RT PCR: NEGATIVE

## 2021-01-24 LAB — TYPE AND SCREEN
ABO/RH(D): O POS
Antibody Screen: NEGATIVE

## 2021-01-24 LAB — PROTEIN / CREATININE RATIO, URINE
Creatinine, Urine: 157 mg/dL
Protein Creatinine Ratio: 0.11 mg/mg{Cre} (ref 0.00–0.15)
Total Protein, Urine: 17 mg/dL

## 2021-01-24 MED ORDER — LIDOCAINE HCL (PF) 1 % IJ SOLN: 30.0000 mL | INTRAMUSCULAR | Status: DC | PRN

## 2021-01-24 MED ORDER — OXYTOCIN-SODIUM CHLORIDE 30-0.9 UT/500ML-% IV SOLN
1.0000 m[IU]/min | INTRAVENOUS | Status: DC
  Administered 2021-01-24: 4 m[IU]/min via INTRAVENOUS
  Filled 2021-01-24: qty 1000

## 2021-01-24 MED ORDER — VANCOMYCIN HCL IN DEXTROSE 1-5 GM/200ML-% IV SOLN
1000.0000 mg | Freq: Two times a day (BID) | INTRAVENOUS | Status: DC
  Administered 2021-01-24: 1000 mg via INTRAVENOUS
  Filled 2021-01-24: qty 200

## 2021-01-24 MED ORDER — OXYTOCIN 10 UNIT/ML IJ SOLN
INTRAMUSCULAR | Status: AC
  Filled 2021-01-24: qty 2

## 2021-01-24 MED ORDER — LACTATED RINGERS IV BOLUS
500.0000 mL | Freq: Once | INTRAVENOUS | Status: AC
  Administered 2021-01-24: 500 mL via INTRAVENOUS

## 2021-01-24 MED ORDER — ONDANSETRON HCL 4 MG/2ML IJ SOLN
4.0000 mg | Freq: Four times a day (QID) | INTRAMUSCULAR | Status: DC | PRN
Start: 2021-01-24 — End: 2021-01-25

## 2021-01-24 MED ORDER — LIDOCAINE HCL (PF) 1 % IJ SOLN
INTRAMUSCULAR | Status: AC
  Filled 2021-01-24: qty 30

## 2021-01-24 MED ORDER — MISOPROSTOL 200 MCG PO TABS
ORAL_TABLET | ORAL | Status: AC
  Filled 2021-01-24: qty 4

## 2021-01-24 MED ORDER — SOD CITRATE-CITRIC ACID 500-334 MG/5ML PO SOLN: 30.0000 mL | ORAL | Status: DC | PRN

## 2021-01-24 MED ORDER — AMMONIA AROMATIC IN INHA
RESPIRATORY_TRACT | Status: AC
  Filled 2021-01-24: qty 10

## 2021-01-24 MED ORDER — LACTATED RINGERS IV SOLN: INTRAVENOUS | Status: DC

## 2021-01-24 MED ORDER — CEFAZOLIN SODIUM-DEXTROSE 2-4 GM/100ML-% IV SOLN: 2.0000 g | Freq: Once | INTRAVENOUS | Status: DC

## 2021-01-24 MED ORDER — OXYTOCIN-SODIUM CHLORIDE 30-0.9 UT/500ML-% IV SOLN
2.5000 [IU]/h | INTRAVENOUS | Status: DC
  Administered 2021-01-25: 2.5 [IU]/h via INTRAVENOUS

## 2021-01-24 MED ORDER — CEFAZOLIN SODIUM-DEXTROSE 1-4 GM/50ML-% IV SOLN: 1.0000 g | Freq: Three times a day (TID) | INTRAVENOUS | Status: DC

## 2021-01-24 MED ORDER — ACETAMINOPHEN 325 MG PO TABS: 650.0000 mg | ORAL_TABLET | ORAL | Status: DC | PRN

## 2021-01-24 MED ORDER — BUTORPHANOL TARTRATE 1 MG/ML IJ SOLN: 1.0000 mg | INTRAMUSCULAR | Status: DC | PRN

## 2021-01-24 MED ORDER — LACTATED RINGERS IV SOLN: 500.0000 mL | INTRAVENOUS | Status: DC | PRN

## 2021-01-24 MED ORDER — OXYTOCIN BOLUS FROM INFUSION
333.0000 mL | Freq: Once | INTRAVENOUS | Status: AC
  Administered 2021-01-25: 333 mL via INTRAVENOUS

## 2021-01-24 MED ORDER — TERBUTALINE SULFATE 1 MG/ML IJ SOLN: 0.2500 mg | Freq: Once | INTRAMUSCULAR | Status: DC | PRN

## 2021-01-24 MED ORDER — SOD CITRATE-CITRIC ACID 500-334 MG/5ML PO SOLN
ORAL | Status: AC
  Administered 2021-01-24: 30 mL via ORAL
  Filled 2021-01-24: qty 15

## 2021-01-24 NOTE — OB Triage Note (Signed)
Pt G6P2 [redacted]w[redacted]d reports for headache for the last 3 days and increased swelling. Pt reports vision changes. Denies LOF/bleeding/epigastric pain. +FM. VSS. Pt reports taking tylenol with no relief. Reports 6/10 pain. Pt reports mild cramping. MD aware.

## 2021-01-24 NOTE — H&P (Signed)
OB ADMISSION/ HISTORY & PHYSICAL:  Admission Date: 01/24/2021  5:35 PM  Admit Diagnosis: Prolonged fetal decel in triage  Virginia Mitchell is a 30 y.o. [id number] presenting for headache and LE edema to triage. PreE workup neg but two prolonged decels to the 60s on the monitor.  Prenatal History: [id number]   EDC : 02/08/2021, Date entered prior to episode creation  P[facility]atal care at [facility] Prenatal course complicated by   30 y.o. [id number] at Patient's last menstrual period was 04/30/2020 (exact date). inconsistent with ultrasound @[redacted]w[redacted]d  on 07/14/20 Dating by early 09/13/20 d/t irregular cycles while breastfeeding.  Estimated Date of Delivery: 02/08/21  Sex of baby and name: " " FOB: 04/10/21   Factors complicating this pregnancy  1. Anemia in pregnancy   12/04/2020 - hgb 10.4  Start on iron supplements   []  Recheck CBC and ferritin at 28 weeks   2. Grandmother of patient is a 02/03/2021 carrier of thalassemia. 3. Obesity in pregnancy   Discussed TWG 11-15 lbs  []  Low dose ASA 81mg  daily - recommended, patient declined   Labs:  Early 1 hour GTT: 104 HgA1C: 5.4  CMP: WNL Urine PCR: WNL TSH:1.454  [x]  for growth in 3rd trimester (36-37 weeks) ordered will do at pnv at 36 weeks  01/14/21: Vertex, EFW 2952g (51%), AFI 21.1cm (80%), FHR 158, Placenta posterior  Anesthesia consult of BMI > 45 by 36 weeks   4. Close interval pregnancies   Last delivery 10/2019  5. Rubella: Non immune: Offer post partum 6. GBS Pos: allergic to PCN. Clindamycin resistant.   Medical / Surgical History :  Past medical history:  Past Medical History:  Diagnosis Date   Anemia      Past surgical history:  Past Surgical History:  Procedure Laterality Date   DILATION AND CURETTAGE OF UTERUS      Family History:  Family History  Problem Relation Age of Onset   Diabetes Paternal Grandmother    Asthma Paternal Grandmother    Migraines Paternal Grandmother    Breast cancer Neg Hx    Ovarian cancer Neg  Hx    Colon cancer Neg Hx      Social History:  reports that she has been smoking cigarettes. She has been smoking an average of .5 packs per day. She has never used smokeless tobacco. She reports that she does not currently use alcohol. She reports that she does not currently use drugs after having used the following drugs: Marijuana.   Allergies: Latex, Sulfa antibiotics, and Penicillins    Current Medications at time of admission:  Prior to Admission medications   Medication Sig Start Date End Date Taking? Authorizing Provider  Prenatal Vit-Fe Fumarate-FA (PRENATAL MULTIVITAMIN) TABS tablet Take 1 tablet by mouth daily at 12 noon.   Yes [provider]  acetaminophen (TYLENOL) 500 MG tablet Take 500 mg by mouth every 6 (six) hours as needed.    [provider]  dibucaine (NUPERCAINAL) 1 % OINT Place 1 application rectally as needed for hemorrhoids. 10/16/19   , CNM  ferrous sulfate 325 (65 FE) MG tablet Take 1 tablet (325 mg total) by mouth 2 (two) times daily with a meal. Patient not taking: Reported on 01/24/2021 10/16/19   11/2019, CNM  pantoprazole (PROTONIX) 40 MG tablet Take 40 mg by mouth daily. Patient not taking: Reported on 01/24/2021    [provider]  senna-docusate (SENOKOT-S) 8.6-50 MG tablet Take 2 tablets by mouth daily as  needed for mild constipation. Patient not taking: Reported on 01/24/2021 10/16/19   Gustavo Lah, CNM  witch hazel-glycerin (TUCKS) pad Apply 1 application topically as needed for hemorrhoids. 10/16/19   Gustavo Lah, CNM     Review of Systems: Active FM + 3 days of headache, dark urine LOF  / SROM: no bloody show no   Physical Exam:  VS: Blood pressure 127/76, pulse 95, temperature 98.7 F (37.1 C), temperature source Oral, resp. rate 17, last menstrual period 04/30/2020, currently breastfeeding.  General: alert and oriented, appears NAD Heart: RRR Lungs: Clear lung fields Abdomen: Gravid,  soft and non-tender, non-distended Extremities: minimal edema  FHT: 145, moderate variability, +accels, +2 min decels to 60s TOCO:q3-5 min SVE:  Dilation: 3 / Effacement (%): 50, 60 / Station: Agricultural consultant by [employer] Labs: Blood type/Rh  O pos  Antibody screen neg  Rubella NON-Immune  Varicella Immune  RPR NR  HBsAg Neg  HIV NR  GC neg  Chlamydia neg  Genetic screening negative  1 hour GTT 104  3 hour GTT n/a  GBS Pos- resistant to clinda    Assessment: [redacted]w[redacted]d weeks gestation Non reassuring fetal testing with 2 deep and long decels in 2 hours of triage monitoring FHR category 2   Plan:  Admit for induction of labor Labs pending Epidural when desired Continuous fetal monitoring   1. Fetal Well being  - Fetal Tracing: Cat II - Ultrasound:  01/14/21:Vertex Fhr=158 bpm Post plac Afi=211 mm 80% Efw= 2952 g 51%  - Group B Streptococcus: pos: will start vanco weight based 20mg /kg q8hrs - Presentation: vtx confirmed by [place] and last u/s   2. Routine OB: - Prenatal labs reviewed, as above - Rh O pos  3. Post Partum Planning: - Infant feeding:breast - Contraception: postpartum BTL, papers signed 01/02/21 and not yet mature - will need interval BTL  4. Induction of Labor:  -  Contractions external toco in place -  Pelvis proven to 3490g -  Plan for induction with pitocin, not miso due to fetal decels. Plan for AROM when clinically appropriate

## 2021-01-25 ENCOUNTER — Inpatient Hospital Stay: Payer: Medicaid Other | Admitting: Anesthesiology

## 2021-01-25 ENCOUNTER — Encounter: Payer: Self-pay | Admitting: Obstetrics and Gynecology

## 2021-01-25 DIAGNOSIS — Z3A38 38 weeks gestation of pregnancy: Secondary | ICD-10-CM | POA: Diagnosis not present

## 2021-01-25 DIAGNOSIS — O99824 Streptococcus B carrier state complicating childbirth: Secondary | ICD-10-CM | POA: Diagnosis not present

## 2021-01-25 LAB — RPR: RPR Ser Ql: NONREACTIVE

## 2021-01-25 MED ORDER — EPHEDRINE 5 MG/ML INJ: 10.0000 mg | INTRAVENOUS | Status: DC | PRN

## 2021-01-25 MED ORDER — SODIUM CHLORIDE 0.9% FLUSH: 3.0000 mL | Freq: Two times a day (BID) | INTRAVENOUS | Status: DC

## 2021-01-25 MED ORDER — FENTANYL-BUPIVACAINE-NACL 0.5-0.125-0.9 MG/250ML-% EP SOLN
12.0000 mL/h | EPIDURAL | Status: DC | PRN
  Administered 2021-01-25: 12 mL/h via EPIDURAL

## 2021-01-25 MED ORDER — BISACODYL 10 MG RE SUPP: 10.0000 mg | Freq: Every day | RECTAL | Status: DC | PRN

## 2021-01-25 MED ORDER — LIDOCAINE HCL (PF) 1 % IJ SOLN
INTRAMUSCULAR | Status: DC | PRN
  Administered 2021-01-25: 3 mL via SUBCUTANEOUS

## 2021-01-25 MED ORDER — ONDANSETRON HCL 4 MG/2ML IJ SOLN: 4.0000 mg | INTRAMUSCULAR | Status: DC | PRN

## 2021-01-25 MED ORDER — FENTANYL-BUPIVACAINE-NACL 0.5-0.125-0.9 MG/250ML-% EP SOLN
EPIDURAL | Status: AC
  Filled 2021-01-25: qty 250

## 2021-01-25 MED ORDER — IBUPROFEN 600 MG PO TABS
600.0000 mg | ORAL_TABLET | Freq: Four times a day (QID) | ORAL | Status: DC
  Administered 2021-01-25 – 2021-01-26 (×4): 600 mg via ORAL
  Filled 2021-01-25 (×4): qty 1

## 2021-01-25 MED ORDER — SODIUM CHLORIDE 0.9% FLUSH: 3.0000 mL | INTRAVENOUS | Status: DC | PRN

## 2021-01-25 MED ORDER — TETANUS-DIPHTH-ACELL PERTUSSIS 5-2.5-18.5 LF-MCG/0.5 IM SUSY: 0.5000 mL | PREFILLED_SYRINGE | Freq: Once | INTRAMUSCULAR | Status: DC

## 2021-01-25 MED ORDER — DIPHENHYDRAMINE HCL 25 MG PO CAPS: 25.0000 mg | ORAL_CAPSULE | Freq: Four times a day (QID) | ORAL | Status: DC | PRN

## 2021-01-25 MED ORDER — WITCH HAZEL-GLYCERIN EX PADS: 1.0000 "application " | MEDICATED_PAD | CUTANEOUS | Status: DC | PRN

## 2021-01-25 MED ORDER — DIPHENHYDRAMINE HCL 50 MG/ML IJ SOLN: 12.5000 mg | INTRAMUSCULAR | Status: DC | PRN

## 2021-01-25 MED ORDER — BENZOCAINE-MENTHOL 20-0.5 % EX AERO
1.0000 "application " | INHALATION_SPRAY | CUTANEOUS | Status: DC | PRN
  Filled 2021-01-25: qty 56

## 2021-01-25 MED ORDER — SODIUM CHLORIDE 0.9 % IV SOLN
INTRAVENOUS | Status: DC | PRN
  Administered 2021-01-25: 9 mL via EPIDURAL

## 2021-01-25 MED ORDER — PRENATAL MULTIVITAMIN CH
1.0000 | ORAL_TABLET | Freq: Every day | ORAL | Status: DC
  Administered 2021-01-25 – 2021-01-27 (×3): 1 via ORAL
  Filled 2021-01-25 (×3): qty 1

## 2021-01-25 MED ORDER — FLEET ENEMA 7-19 GM/118ML RE ENEM: 1.0000 | ENEMA | Freq: Every day | RECTAL | Status: DC | PRN

## 2021-01-25 MED ORDER — SIMETHICONE 80 MG PO CHEW: 80.0000 mg | CHEWABLE_TABLET | ORAL | Status: DC | PRN

## 2021-01-25 MED ORDER — ONDANSETRON HCL 4 MG PO TABS: 4.0000 mg | ORAL_TABLET | ORAL | Status: DC | PRN

## 2021-01-25 MED ORDER — ACETAMINOPHEN 325 MG PO TABS
650.0000 mg | ORAL_TABLET | ORAL | Status: DC | PRN
  Administered 2021-01-25 – 2021-01-27 (×4): 650 mg via ORAL
  Filled 2021-01-25 (×4): qty 2

## 2021-01-25 MED ORDER — PHENYLEPHRINE 40 MCG/ML (10ML) SYRINGE FOR IV PUSH (FOR BLOOD PRESSURE SUPPORT): 80.0000 ug | PREFILLED_SYRINGE | INTRAVENOUS | Status: DC | PRN

## 2021-01-25 MED ORDER — COCONUT OIL OIL
1.0000 "application " | TOPICAL_OIL | Status: DC | PRN
  Filled 2021-01-25: qty 120

## 2021-01-25 MED ORDER — SODIUM CHLORIDE 0.9 % IV SOLN: 250.0000 mL | INTRAVENOUS | Status: DC | PRN

## 2021-01-25 MED ORDER — LIDOCAINE-EPINEPHRINE (PF) 1.5 %-1:200000 IJ SOLN
INTRAMUSCULAR | Status: DC | PRN
  Administered 2021-01-25: 3 mL via EPIDURAL

## 2021-01-25 MED ORDER — OXYCODONE HCL 5 MG PO TABS: 5.0000 mg | ORAL_TABLET | ORAL | Status: DC | PRN

## 2021-01-25 MED ORDER — MEASLES, MUMPS & RUBELLA VAC IJ SOLR
0.5000 mL | Freq: Once | INTRAMUSCULAR | Status: AC
  Administered 2021-01-27: 0.5 mL via SUBCUTANEOUS
  Filled 2021-01-25 (×2): qty 0.5

## 2021-01-25 MED ORDER — ZOLPIDEM TARTRATE 5 MG PO TABS: 5.0000 mg | ORAL_TABLET | Freq: Every evening | ORAL | Status: DC | PRN

## 2021-01-25 MED ORDER — LACTATED RINGERS IV SOLN: 500.0000 mL | Freq: Once | INTRAVENOUS | Status: DC

## 2021-01-25 MED ORDER — DIBUCAINE (PERIANAL) 1 % EX OINT: 1.0000 "application " | TOPICAL_OINTMENT | CUTANEOUS | Status: DC | PRN

## 2021-01-25 MED ORDER — SENNOSIDES-DOCUSATE SODIUM 8.6-50 MG PO TABS
2.0000 | ORAL_TABLET | ORAL | Status: DC
  Administered 2021-01-25 – 2021-01-27 (×3): 2 via ORAL
  Filled 2021-01-25 (×3): qty 2

## 2021-01-25 NOTE — Anesthesia Preprocedure Evaluation (Signed)
Anesthesia Evaluation  Patient identified by MRN, date of birth, ID band Patient awake    Reviewed: Allergy & Precautions, NPO status , Patient's Chart, lab work & pertinent test results  History of Anesthesia Complications Negative for: history of anesthetic complications  Airway Mallampati: II  TM Distance: >3 FB Neck ROM: Full    Dental no notable dental hx. (+) Teeth Intact   Pulmonary neg sleep apnea, neg COPD, Current SmokerPatient did not abstain from smoking.,    Pulmonary exam normal breath sounds clear to auscultation       Cardiovascular Exercise Tolerance: Good METS(-) hypertension(-) CAD and (-) Past MI negative cardio ROS  (-) dysrhythmias  Rhythm:Regular Rate:Normal - Systolic murmurs    Neuro/Psych negative neurological ROS  negative psych ROS   GI/Hepatic neg GERD  ,(+)     (-) substance abuse  ,   Endo/Other  neg diabetes  Renal/GU negative Renal ROS     Musculoskeletal   Abdominal (+) + obese,   Peds  Hematology  (+) anemia ,   Anesthesia Other Findings Past Medical History: No date: Anemia  Reproductive/Obstetrics (+) Pregnancy                             Anesthesia Physical Anesthesia Plan  ASA: 2  Anesthesia Plan: Epidural   Post-op Pain Management:    Induction:   PONV Risk Score and Plan: 2 and Treatment may vary due to age or medical condition and Ondansetron  Airway Management Planned: Natural Airway  Additional Equipment:   Intra-op Plan:   Post-operative Plan:   Informed Consent: I have reviewed the patients History and Physical, chart, labs and discussed the procedure including the risks, benefits and alternatives for the proposed anesthesia with the patient or authorized representative who has indicated his/her understanding and acceptance.       Plan Discussed with: Surgeon  Anesthesia Plan Comments: (Discussed R/B/A of neuraxial  anesthesia technique with patient: - rare risks of spinal/epidural hematoma, nerve damage, infection - Risk of PDPH - Risk of itching - Risk of nausea and vomiting - Risk of poor block necessitating replacement of epidural. - Risk of allergic reactions. Patient voiced understanding. Patient counseled on benefits of smoking cessation, and increased perioperative risks associated with continued smoking. )        Anesthesia Quick Evaluation

## 2021-01-25 NOTE — Anesthesia Procedure Notes (Signed)
Epidural Patient location during procedure: OB Start time: 01/25/2021 12:40 AM End time: 01/25/2021 1:04 AM  Staffing Anesthesiologist: Corinda Gubler, MD Performed: anesthesiologist   Preanesthetic Checklist Completed: patient identified, IV checked, site marked, risks and benefits discussed, surgical consent, monitors and equipment checked, pre-op evaluation and timeout performed  Epidural Patient position: sitting Prep: ChloraPrep Patient monitoring: heart rate, continuous pulse ox and blood pressure Approach: midline Location: L3-L4 Injection technique: LOR saline  Needle:  Needle type: Tuohy  Needle gauge: 18 G Needle length: 9 cm and 9 Needle insertion depth: 6 cm Catheter type: closed end Catheter size: 19 Gauge Catheter at skin depth: 11 cm Test dose: negative and 1.5% lidocaine with Epi 1:200 K  Assessment Sensory level: T10 Events: blood not aspirated, injection not painful, no injection resistance, no paresthesia and negative IV test  Additional Notes first attempt Pt. Evaluated and documentation done after procedure finished. Patient identified. Risks/Benefits/Options discussed with patient including but not limited to bleeding, infection, nerve damage, paralysis, failed block, incomplete pain control, headache, blood pressure changes, nausea, vomiting, reactions to medication both or allergic, itching and postpartum back pain. Confirmed with bedside nurse the patient's most recent platelet count. Confirmed with patient that they are not currently taking any anticoagulation, have any bleeding history or any family history of bleeding disorders. Patient expressed understanding and wished to proceed. All questions were answered. Sterile technique was used throughout the entire procedure. Please see nursing notes for vital signs. Test dose was given through epidural catheter and negative prior to continuing to dose epidural or start infusion. Warning signs of high block given  to the patient including shortness of breath, tingling/numbness in hands, complete motor block, or any concerning symptoms with instructions to call for help. Patient was given instructions on fall risk and not to get out of bed. All questions and concerns addressed with instructions to call with any issues or inadequate analgesia.     Patient tolerated the insertion well without immediate complications.  Reason for block: procedure for painReason for block:procedure for pain

## 2021-01-25 NOTE — Lactation Note (Signed)
This note was copied from a baby's chart. Lactation Consultation Note  Patient Name: Virginia Mitchell Today's Date: 01/25/2021   Age:30 hours  Maternal Data  Mom has hx of mastitis and abcess in right breast around nipple, this nipple is more firm, less elastic  Feeding  BAby easily latches to left breast, has not fed on right, mom states she prefers to pump breasts, esp right breast since she had so much trouble in the past.  [employer] electric pump set up and pt pumped right breast and obtained 2 cc    LATCH Score   See flowsheet                 Lactation Tools Discussed/Used  Mom encouraged to apply for [facility] so that she may obtain an electric pump from [facility], she has an older pump at home.    Interventions  LC name and no written on white board  Discharge    Consult Status      Dyann Kief 01/25/2021, 9:03 PM

## 2021-01-25 NOTE — Progress Notes (Signed)
Virginia Mitchell is a 30 y.o. [id number] at [redacted]w[redacted]d by ultrasound admitted for fetal decels in triage (presented with HA, but wnl BP) - AROM now for clear fluid  Subjective: Comfortable with epidural  Objective: BP 120/78   Pulse 86   Temp 97.7 F (36.5 C) (Oral)   Resp 16   Ht 5' (1.524 m)   Wt 91.2 kg   LMP 04/30/2020 Comment: [redacted] weeks pregnant  SpO2 100%   BMI 39.26 kg/m  No intake/output data recorded. No intake/output data recorded.  FHT:  FHR: 140 bpm, variability: moderate,  accelerations:  Present,  decelerations:  Present intermittent variables UC:   regular, every 3 minutes SVE:   Dilation: 4.5 Effacement (%): 70, 80 Station: [employer] Exam by:: B Darnell RN  Labs: Lab Results  Component Value Date   WBC 11.8 (H) 01/24/2021   HGB 10.1 (L) 01/24/2021   HCT 30.3 (L) 01/24/2021   MCV 86.1 01/24/2021   PLT 330 01/24/2021    Assessment / Plan: Induction of labor due to non-reassuring fetal testing,  progressing well on pitocin  Labor: progressing, now with AROM for clear fluid, feeling some pressure Preeclampsia:   n/a Fetal Wellbeing:  Category II Pain Control:  Epidural I/D:   vanco for GBS with PCN allergy and clind resistance Anticipated MOD:  NSVD  Christeen Douglas 01/25/2021, 6:40 AM

## 2021-01-25 NOTE — Discharge Summary (Signed)
See d/c summary on 01/27/21

## 2021-01-26 DIAGNOSIS — O9081 Anemia of the puerperium: Secondary | ICD-10-CM | POA: Diagnosis not present

## 2021-01-26 LAB — CBC
HCT: 28 % — ABNORMAL LOW (ref 36.0–46.0)
Hemoglobin: 8.9 g/dL — ABNORMAL LOW (ref 12.0–15.0)
MCH: 27.4 pg (ref 26.0–34.0)
MCHC: 31.8 g/dL (ref 30.0–36.0)
MCV: 86.2 fL (ref 80.0–100.0)
Platelets: 349 10*3/uL (ref 150–400)
RBC: 3.25 MIL/uL — ABNORMAL LOW (ref 3.87–5.11)
RDW: 13.4 % (ref 11.5–15.5)
WBC: 16.1 10*3/uL — ABNORMAL HIGH (ref 4.0–10.5)
nRBC: 0 % (ref 0.0–0.2)

## 2021-01-26 MED ORDER — IBUPROFEN 600 MG PO TABS
600.0000 mg | ORAL_TABLET | Freq: Four times a day (QID) | ORAL | Status: DC
  Administered 2021-01-26 – 2021-01-27 (×6): 600 mg via ORAL
  Filled 2021-01-26 (×6): qty 1

## 2021-01-26 NOTE — Anesthesia Postprocedure Evaluation (Signed)
Anesthesia Post Note  Patient: Virginia Mitchell  Procedure(s) Performed: AN AD HOC LABOR EPIDURAL  Anesthesia Type: Epidural Anesthetic complications: no   No notable events documented.   Last Vitals:  Vitals:   01/25/21 2346 01/26/21 0718  BP: 116/69 97/74  Pulse: 86 87  Resp: 18 16  Temp: 36.4 C 36.7 C  SpO2: 99% 98%    Last Pain:  Vitals:   01/26/21 1157  TempSrc:   PainSc: 7                  Johny Drilling

## 2021-01-26 NOTE — Progress Notes (Signed)
Postpartum Day  1  Subjective: no complaints, up ad lib, voiding, tolerating PO, and + flatus  Doing well, no concerns. Ambulating without difficulty, pain managed with PO meds, tolerating regular diet, and voiding without difficulty.   No fever/chills, chest pain, shortness of breath, nausea/vomiting, or leg pain. No nipple or breast pain. No headache, visual changes, or RUQ/epigastric pain.  Objective: BP 97/74 (BP Location: Left Arm)   Pulse 87   Temp 98.1 F (36.7 C) (Oral)   Resp 16   Ht 5' (1.524 m)   Wt 91.2 kg   LMP 04/30/2020 Comment: [redacted] weeks pregnant  SpO2 98%   Breastfeeding Unknown   BMI 39.26 kg/m    Physical Exam:  General: alert, cooperative, and appears stated age Breasts: soft/nontender CV: RRR Pulm: nl effort, CTABL Abdomen: soft, non-tender, active bowel sounds Uterine Fundus: firm Perineum: minimal edema, intact Lochia: appropriate DVT Evaluation: No evidence of DVT seen on physical exam. Negative Homan's sign.  Recent Labs    01/24/21 1903 01/26/21 0603  HGB 10.1* 8.9*  HCT 30.3* 28.0*  WBC 11.8* 16.1*  PLT 330 349    Assessment/Plan: 30 y.o. [id number] postpartum day # 1  -Continue routine postpartum care -Lactation consult PRN for breastfeeding  -Discussed contraceptive options including implant, IUDs hormonal and non-hormonal, injection, pills/ring/patch, condoms, and NFP.  -Acute blood loss anemia - hemodynamically stable and asymptomatic; start PO ferrous sulfate BID with stool softeners  -Immunization status:   needs MMR prior to discharge    Disposition: Continue inpatient postpartum care    LOS: 2 days   [place], CNM 01/26/2021, 16:60 AM   ----- Certified Nurse Midwife [facility]

## 2021-01-27 MED ORDER — BENZOCAINE-MENTHOL 20-0.5 % EX AERO: 1.0000 "application " | INHALATION_SPRAY | CUTANEOUS | Status: DC | PRN

## 2021-01-27 MED ORDER — DIBUCAINE (PERIANAL) 1 % EX OINT: 1.0000 "application " | TOPICAL_OINTMENT | CUTANEOUS | Status: DC | PRN

## 2021-01-27 MED ORDER — ACETAMINOPHEN 325 MG PO TABS: 650.0000 mg | ORAL_TABLET | ORAL | Status: DC | PRN

## 2021-01-27 MED ORDER — IBUPROFEN 600 MG PO TABS: 600.0000 mg | ORAL_TABLET | Freq: Four times a day (QID) | ORAL | 0 refills | Status: DC

## 2021-01-27 MED ORDER — WITCH HAZEL-GLYCERIN EX PADS: 1.0000 "application " | MEDICATED_PAD | CUTANEOUS | 12 refills | Status: DC | PRN

## 2021-01-27 MED ORDER — SENNOSIDES-DOCUSATE SODIUM 8.6-50 MG PO TABS: 2.0000 | ORAL_TABLET | ORAL | Status: DC

## 2021-01-27 MED ORDER — COCONUT OIL OIL: 1.0000 "application " | TOPICAL_OIL | 0 refills | Status: DC | PRN

## 2021-01-27 NOTE — Discharge Summary (Signed)
Obstetrical Discharge Summary  Patient Name: Virginia Mitchell DOB: 1990-12-25 MRN: [id number]  Date of Admission: 01/24/2021 Date of Discharge: 01/27/2021  Primary OB: [facility]  Gestational Age at Delivery: [redacted]w[redacted]d   Antepartum complications:   Anemia in pregnancy Obesity Close interval pregnancy Rubella non-immune GBS positive: allergic PCN, clinda resistant   Admitting Diagnosis:  Secondary Diagnosis: Patient Active Problem List   Diagnosis Date Noted   Indication for care or intervention related to labor and delivery 01/24/2021   Non-reassuring electronic fetal monitoring tracing 01/24/2021   Preterm contractions 01/08/2021   Encounter for supervision of other normal pregnancy, third trimester 08/07/2020   Breast abscess 03/07/2020   Abdominal pain affecting pregnancy 08/08/2019    Augmentation: AROM and Pitocin Complications: None Intrapartum complications/course: uncomplicated Date of Delivery: 01/25/21 Delivered By: Christeen Douglas  Delivery Type: spontaneous vaginal delivery Anesthesia: epidural Placenta: Spontaneous Laceration: none Episiotomy: none Newborn Data: Live born female "Damario" Birth Weight: 8 lb 6 oz (3800 g) APGAR: 8, 9  Newborn Delivery   Birth date/time: 01/25/2021 07:07:00 Delivery type: Vaginal, Spontaneous       Brief Hospital Course  Virginia Mitchell is a [id number] who had a SVD on 01/25/21;  for further details of this delivery, please refer to the delivery note.  Patient had an uncomplicated postpartum course.  By time of discharge on PPD#2, her pain was controlled on oral pain medications; she had appropriate lochia and was ambulating, voiding without difficulty and tolerating regular diet.  She was deemed stable for discharge to home.    Discharge Physical Exam:  BP 128/73 (BP Location: Right Arm)   Pulse 97   Temp 98.5 F (36.9 C) (Oral)   Resp 18   Ht 5' (1.524 m)   Wt 91.2 kg   LMP 04/30/2020 Comment: [redacted] weeks  pregnant  SpO2 99%   Breastfeeding Unknown   BMI 39.26 kg/m   General: NAD CV: RRR Pulm: CTABL, nl effort ABD: s/nd/nt, fundus firm and below the umbilicus Lochia: moderate DVT Evaluation: LE non-ttp, no evidence of DVT on exam.  Hemoglobin  Date Value Ref Range Status  01/26/2021 8.9 (L) 12.0 - 15.0 g/dL Final   HGB  Date Value Ref Range Status  02/08/2013 13.5 12.0 - 16.0 g/dL Final   HCT  Date Value Ref Range Status  01/26/2021 28.0 (L) 36.0 - 46.0 % Final  02/09/2013 31.9 (L) 35.0 - 47.0 % Final    Post partum course: normal Postpartum Procedures: none Edinburgh:  Edinburgh Postnatal Depression Scale Screening Tool 01/25/2021 10/15/2019 10/15/2019 10/14/2019  I have been able to laugh and see the funny side of things. 0 0 (No Data) (No Data)  I have looked forward with enjoyment to things. 0 0 - -  I have blamed myself unnecessarily when things went wrong. 1 0 - -  I have been anxious or worried for no good reason. 0 0 - -  I have felt scared or panicky for no good reason. 0 0 - -  Things have been getting on top of me. 0 0 - -  I have been so unhappy that I have had difficulty sleeping. 0 0 - -  I have felt sad or miserable. 0 0 - -  I have been so unhappy that I have been crying. 0 0 - -  The thought of harming myself has occurred to me. 0 0 - -  Edinburgh Postnatal Depression Scale Total 1 0 - -    Disposition: stable,  discharge to home. Baby Feeding: breastmilk/formula Baby Disposition: home with mom  Rh Immune globulin given:N/A Rubella vaccine given: N/A  Flu vaccine given in AP or PP setting: declined Tdap vaccine given in AP or PP setting: 07/23/20   Contraception: BTL: signed papers on 01/02/21  Prenatal Labs: Blood type/Rh --/--/O POS (09/22 2233)  Antibody screen neg  Rubella Immune  Varicella Immune  RPR NR  HBsAg Neg  HIV NR  GC neg  Chlamydia neg  Genetic screening negative  1 hour GTT 104  3 hour GTT n/a  GBS Pos- on vanco      Plan:  Virginia Mitchell was discharged to home in good condition. Follow-up appointment at [facility] with delivering provider in 2 weeks for BTL preop appt.   Discharge Medications: Allergies as of 01/27/2021       Reactions   Latex Hives   Sulfa Antibiotics Shortness Of Breath   Penicillins Other (See Comments)   Reaction when little.         Medication List     TAKE these medications    acetaminophen 325 MG tablet Commonly known as: Tylenol Take 2 tablets (650 mg total) by mouth every 4 (four) hours as needed (for pain scale < 4). What changed:  medication strength how much to take when to take this reasons to take this   benzocaine-Menthol 20-0.5 % Aero Commonly known as: DERMOPLAST Apply 1 application topically as needed for irritation (perineal discomfort).   coconut oil Oil Apply 1 application topically as needed.   dibucaine 1 % Oint Commonly known as: NUPERCAINAL Place 1 application rectally as needed for hemorrhoids (if tucks not working). What changed: reasons to take this   ferrous sulfate 325 (65 FE) MG tablet Take 1 tablet (325 mg total) by mouth 2 (two) times daily with a meal.   ibuprofen 600 MG tablet Commonly known as: ADVIL Take 1 tablet (600 mg total) by mouth every 6 (six) hours.   pantoprazole 40 MG tablet Commonly known as: PROTONIX Take 40 mg by mouth daily.   prenatal multivitamin Tabs tablet Take 1 tablet by mouth daily at 12 noon.   senna-docusate 8.6-50 MG tablet Commonly known as: Senokot-S Take 2 tablets by mouth daily as needed for mild constipation. What changed: Another medication with the same name was added. Make sure you understand how and when to take each.   senna-docusate 8.6-50 MG tablet Commonly known as: Senokot-S Take 2 tablets by mouth daily. What changed: You were already taking a medication with the same name, and this prescription was added. Make sure you understand how and when to take  each.   witch hazel-glycerin pad Commonly known as: TUCKS Apply 1 application topically as needed for hemorrhoids (for pain). What changed: reasons to take this          Follow-up Information     Christeen Douglas, MD. Schedule an appointment as soon as possible for a visit in 2 week(s).   Specialty: Obstetrics and Gynecology Why: Preop appt for BTL Contact information: [address] [place] [address] [phone]                 Signed: Chari Manning CNM

## 2021-01-27 NOTE — Progress Notes (Signed)
Discharge instructions for patient and newborn gone over with patient.  Discharge papers placed in packet and given to patient.

## 2021-01-28 ENCOUNTER — Telehealth: Payer: Self-pay

## 2021-01-28 NOTE — Telephone Encounter (Signed)
Transition Care Management Unsuccessful Follow-up Telephone Call  Date of discharge and from where:  01/27/2021-[facility]  Attempts:  1st Attempt  Reason for unsuccessful TCM follow-up call:  Left voice message

## 2021-02-04 NOTE — Telephone Encounter (Signed)
Transition Care Management Follow-up Telephone Call Date of discharge and from where: 01/27/2021 [facility] How have you been since you were released from the hospital? Pt stated that she is feeling good and did not have any questions at this time.  Any questions or concerns? No  Items Reviewed: Did the pt receive and understand the discharge instructions provided? Yes  Medications obtained and verified? Yes  Other? No  Any new allergies since your discharge? No  Dietary orders reviewed? No Do you have support at home? Yes   Functional Questionnaire: (I = Independent and D = Dependent) ADLs: I Bathing/Dressing- I Meal Prep- I Eating- I Maintaining continence- I Transferring/Ambulation- I Managing Meds- I  Follow up appointments reviewed:  PCP Hospital f/u appt confirmed? No   Specialist Hospital f/u appt confirmed? No   Are transportation arrangements needed? No  If their condition worsens, is the pt aware to call PCP or go to the Emergency Dept.? Yes Was the patient provided with contact information for the PCP's office or ED? Yes Was to pt encouraged to call back with questions or concerns? Yes

## 2021-02-25 DIAGNOSIS — Z3009 Encounter for other general counseling and advice on contraception: Secondary | ICD-10-CM | POA: Diagnosis not present

## 2021-02-28 ENCOUNTER — Other Ambulatory Visit: Payer: Medicaid Other

## 2021-03-01 ENCOUNTER — Ambulatory Visit: Admit: 2021-03-01 | Payer: Medicaid Other | Admitting: Obstetrics and Gynecology

## 2021-03-01 SURGERY — LIGATION, FALLOPIAN TUBE, LAPAROSCOPIC
Anesthesia: Choice | Laterality: Bilateral

## 2021-03-20 DIAGNOSIS — Z30017 Encounter for initial prescription of implantable subdermal contraceptive: Secondary | ICD-10-CM | POA: Diagnosis not present

## 2021-03-20 DIAGNOSIS — Z01812 Encounter for preprocedural laboratory examination: Secondary | ICD-10-CM | POA: Diagnosis not present

## 2021-04-08 ENCOUNTER — Ambulatory Visit: Payer: Medicaid Other | Admitting: Surgery

## 2021-04-19 IMAGING — US US OB < 14 WEEKS - US OB TV
1 series · 14 of 28 positions shown · non-contrast
Comparison: None.

CLINICAL DATA: 28-year-old pregnant female with left lower quadrant
abdominal pain. LMP: 01/07/2019 corresponding to an estimated
gestational age of 8 weeks, 0 days.

EXAM:
OBSTETRIC <14 WK US AND TRANSVAGINAL OB US
TECHNIQUE: Both transabdominal and transvaginal ultrasound examinations were
performed for complete evaluation of the gestation as well as the
maternal uterus, adnexal regions, and pelvic cul-de-sac.
Transvaginal technique was performed to assess early pregnancy.

[Series 1: us ob < 14 weeks - us ob tv · 14 of 119 slices shown]
[im 5/119]
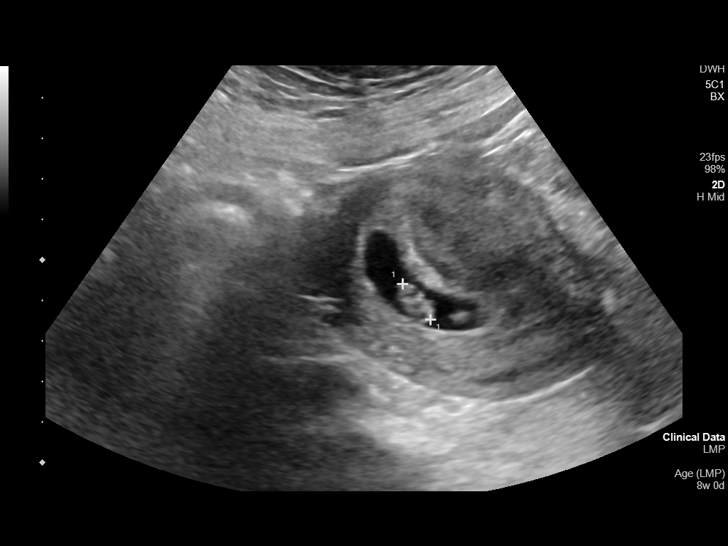
[im 14/119]
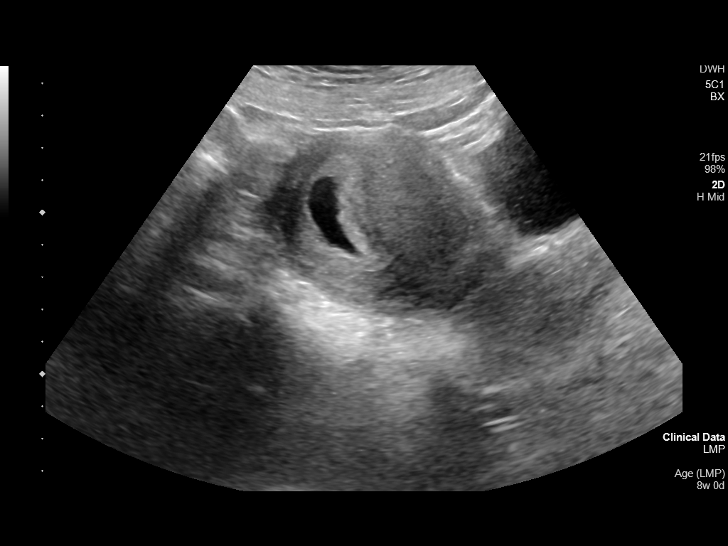
[im 22/119]
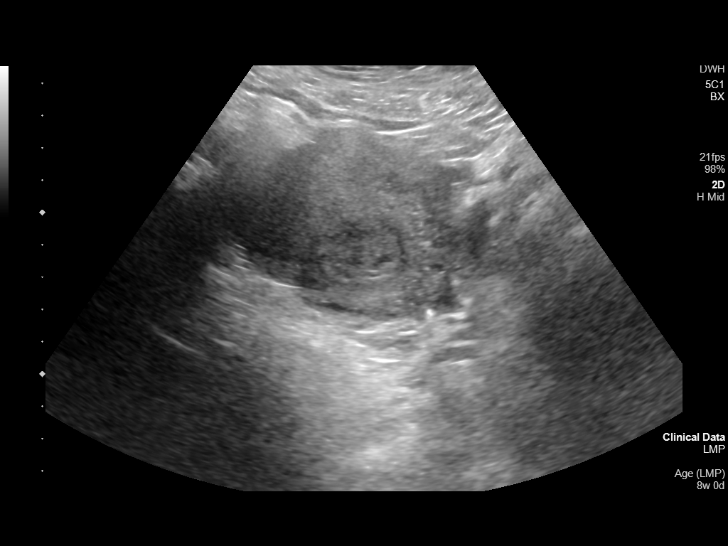
[im 31/119]
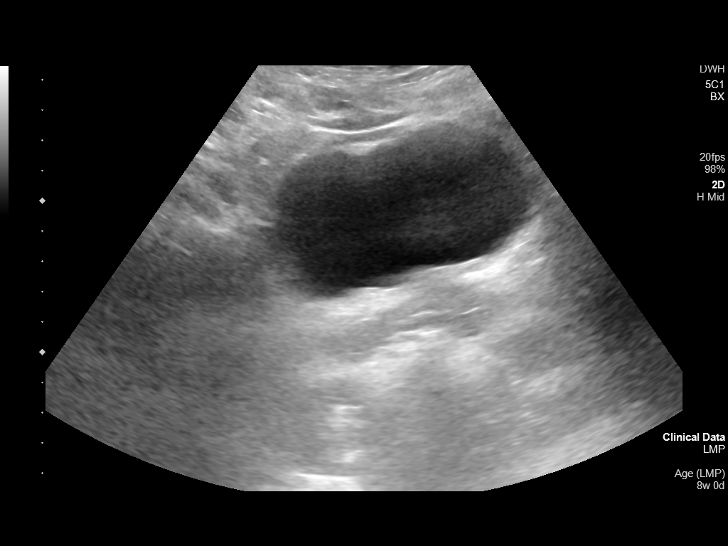
[im 40/119]
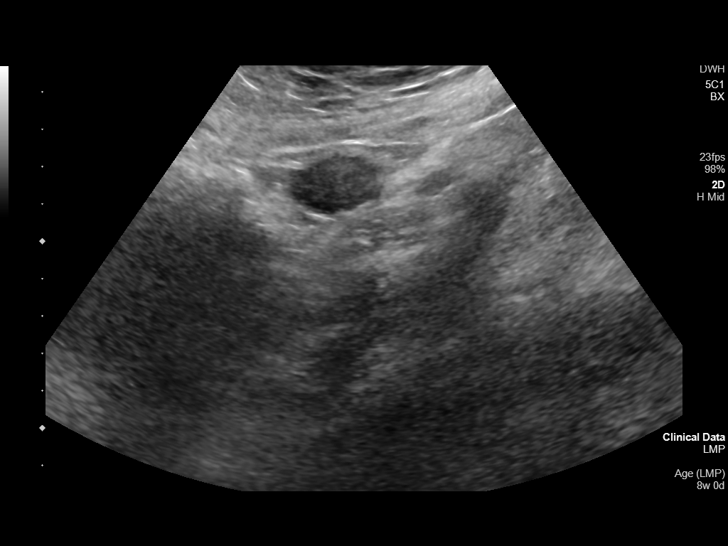
[im 49/119]
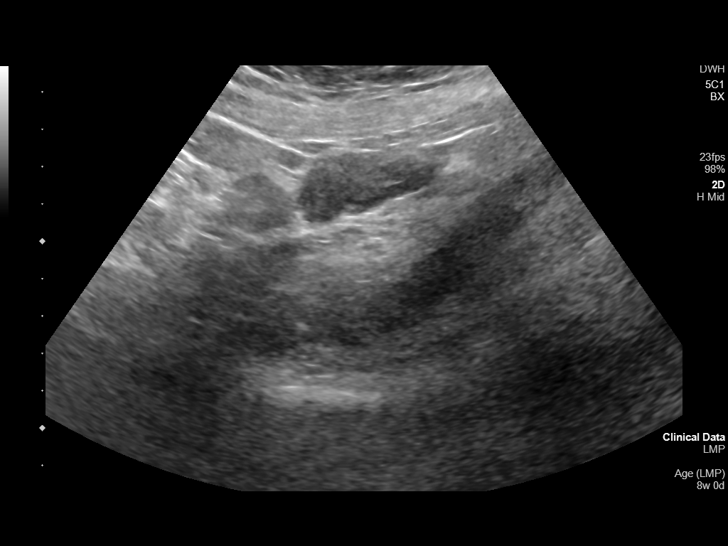
[im 57/119]
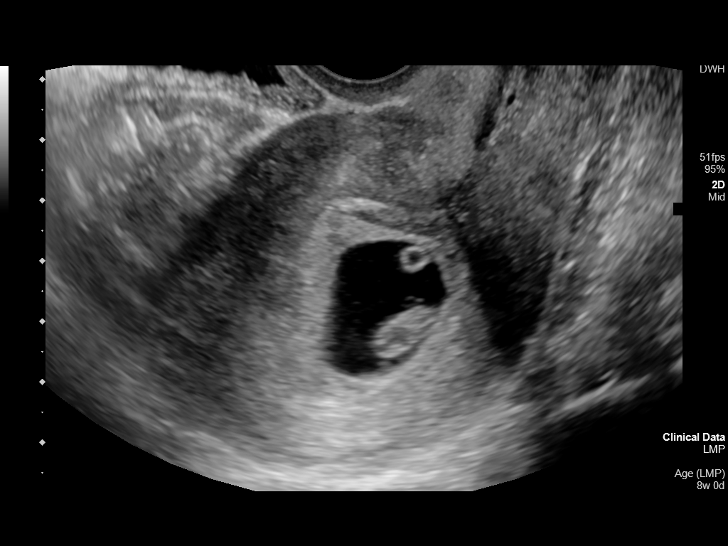
[im 66/119]
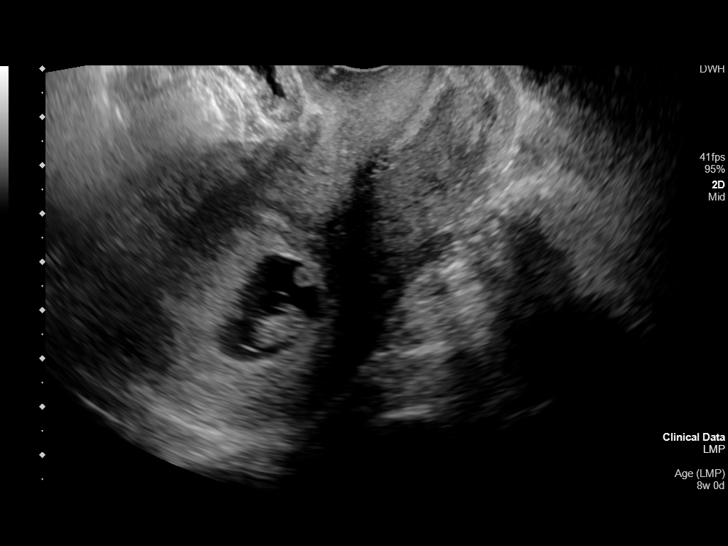
[im 75/119]
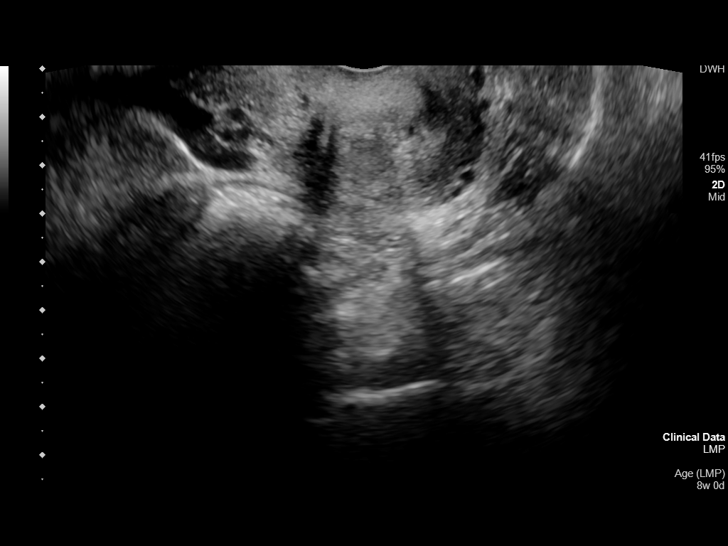
[im 84/119]
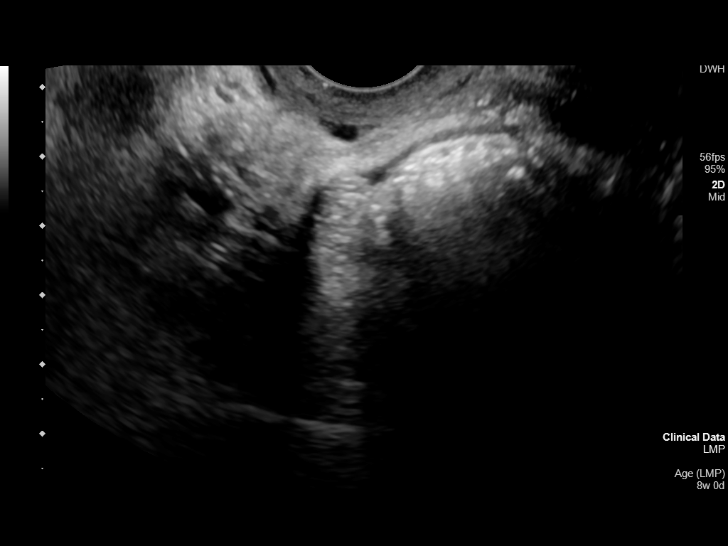
[im 92/119]
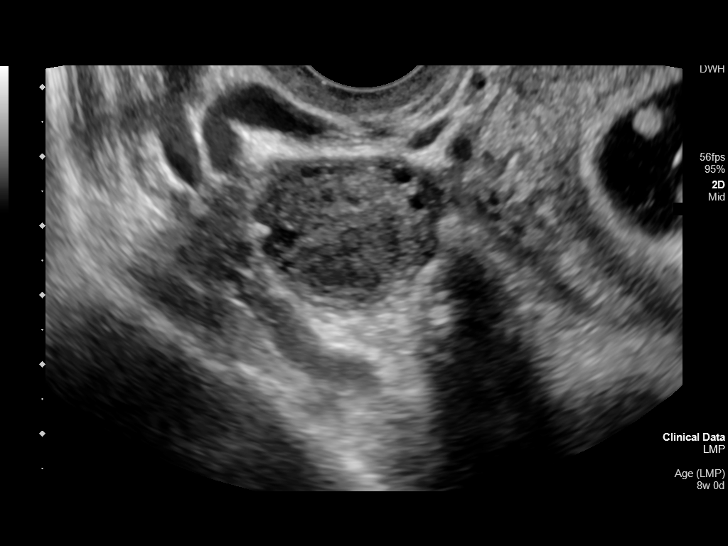
[im 101/119]
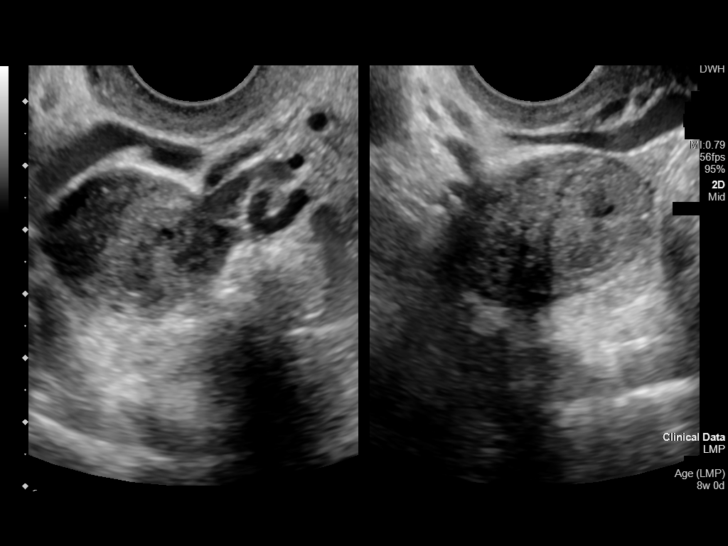
[im 110/119]
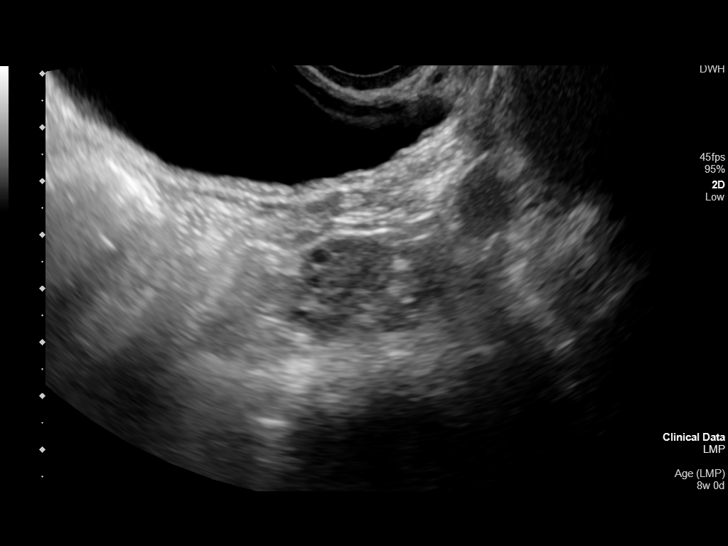
[im 119/119]
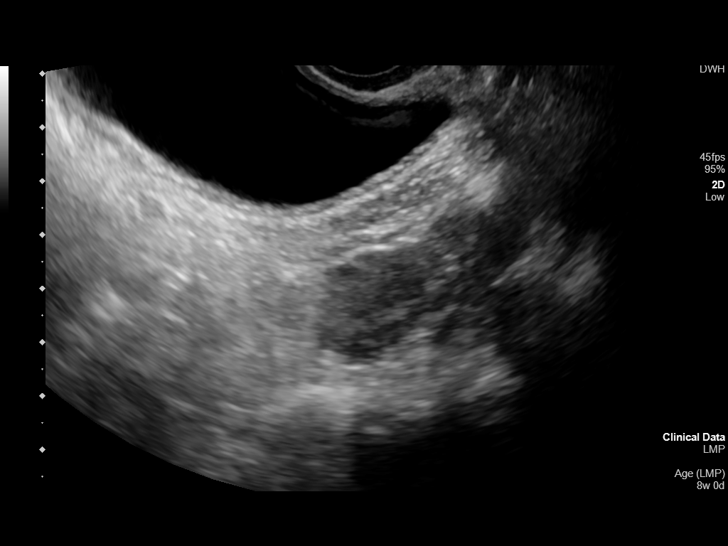

[14 of 28 positions shown; findings below may reference images not displayed]

FINDINGS: Intrauterine gestational sac: Single intrauterine gestational sac.

Yolk sac:  Seen

Embryo:  Present

Cardiac Activity: Detected

Heart Rate: 171 bpm

CRL:  14 mm   7 w   5 d                  US EDC: 10/16/2019

Subchorionic hemorrhage:  None visualized.

Maternal uterus/adnexae: The maternal ovaries are unremarkable.
There is a 2 cm corpus luteum in the right ovary.
IMPRESSION: Single live intrauterine pregnancy with an estimated gestational age
of 7 weeks, 5 days based on today's crown-rump length.

## 2022-08-30 IMAGING — US US BREAST*R* LIMITED INC AXILLA
1 series · 3 of 3 positions shown · non-contrast
Comparison: 03/04/2020

CLINICAL DATA: Abscess in February 2020, breast tenderness

EXAM:
ULTRASOUND OF THE RIGHT BREAST

[Series 1: us breast complete uni right inc axilla · 3 of 3 slices shown]
[im 1/3]
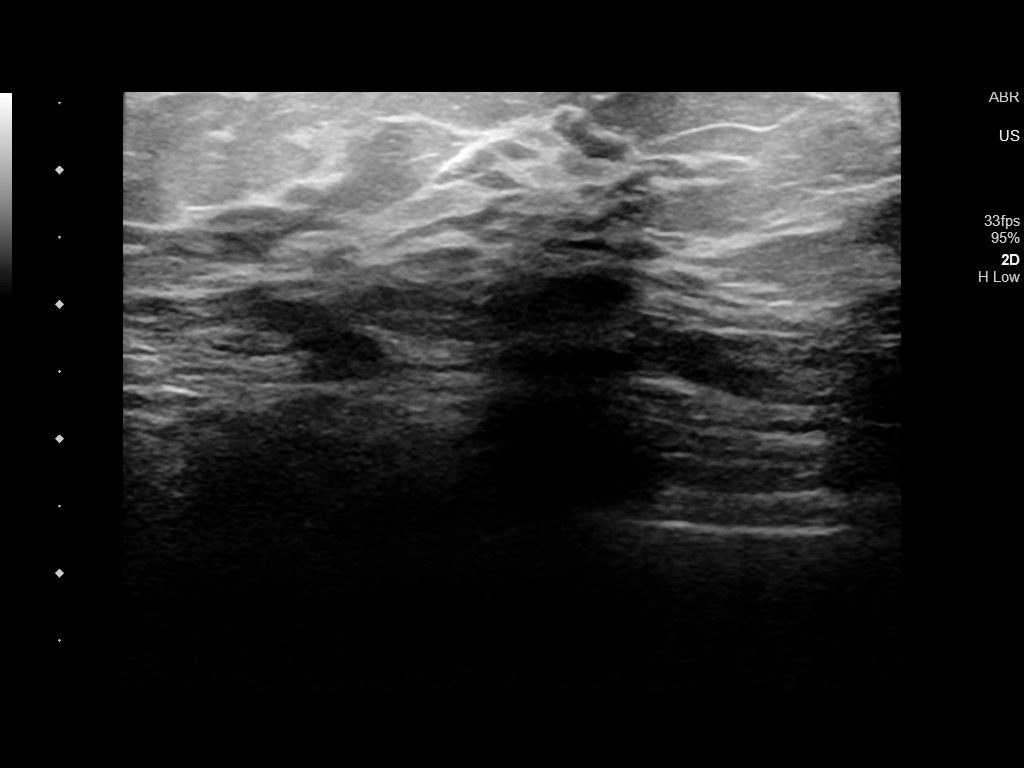
[im 2/3]
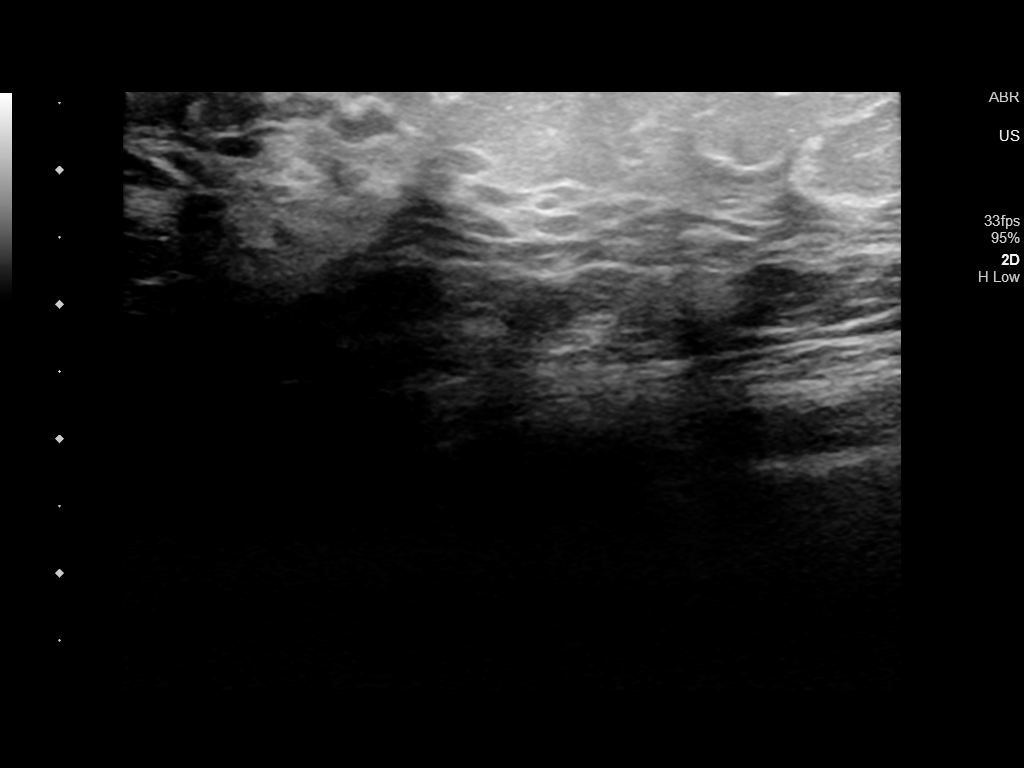
[im 3/3]
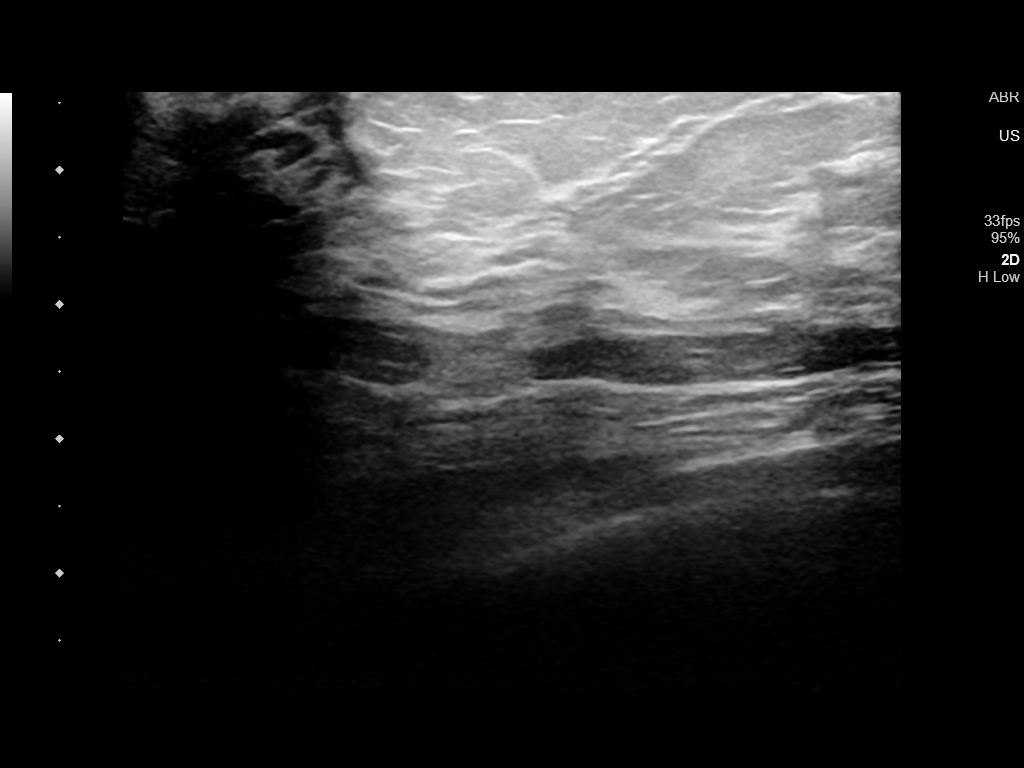

[3 of 3 positions shown; findings below may reference images not displayed]

FINDINGS: Targeted ultrasound examination of the right breast at the patient
identified site of pain, 6 o'clock face in the periareolar breast,
reveals no mass, cyst, or other abnormality. Previously noted
abscess is resolved.
IMPRESSION: No mass, cyst, or other abnormality to explain pain. No evidence of
abscess.

RECOMMENDATION:
Return to clinical management for breast pain without ultrasound
abnormality. Additional imaging if indicated by persistent symptoms
or other concerning clinical change.

I have discussed the findings and recommendations with the patient.
If applicable, a reminder letter will be sent to the patient
regarding the next appointment.

BI-RADS CATEGORY  1: Negative.

## 2022-08-30 IMAGING — US US OB COMP LESS 14 WK
1 series · 14 of 22 positions shown · non-contrast
Comparison: None.

CLINICAL DATA: Cramping.

EXAM:
OBSTETRIC <14 WK ULTRASOUND
TECHNIQUE: Transabdominal ultrasound was performed for evaluation of the
gestation as well as the maternal uterus and adnexal regions.

[Series 1: us ob comp less 14 wks · 14 of 22 slices shown]
[im 1/22]
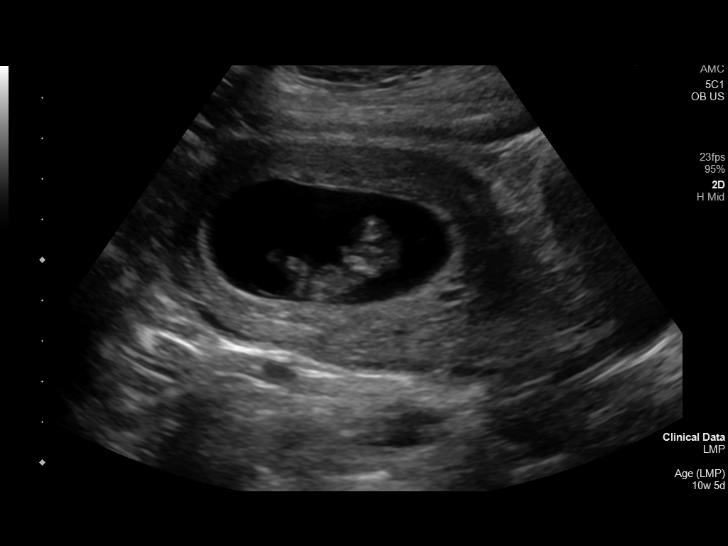
[im 3/22]
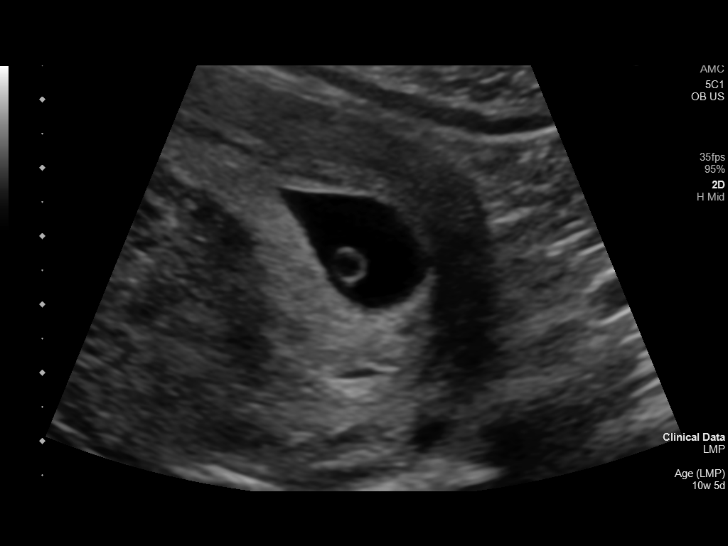
[im 4/22]
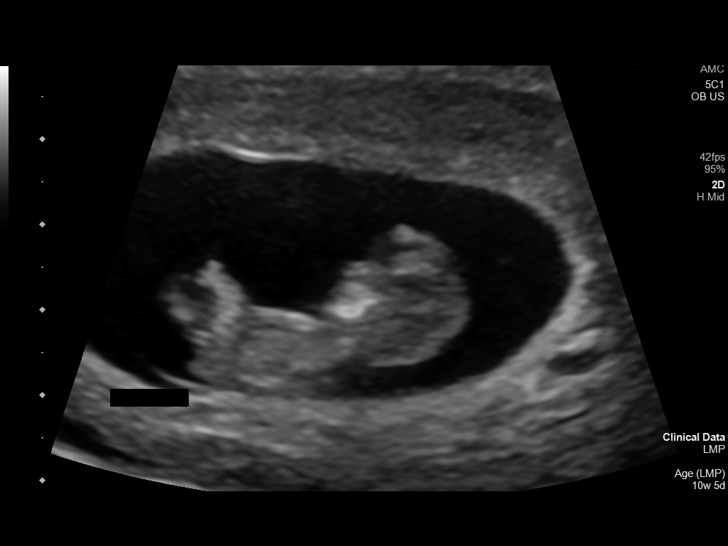
[im 6/22]
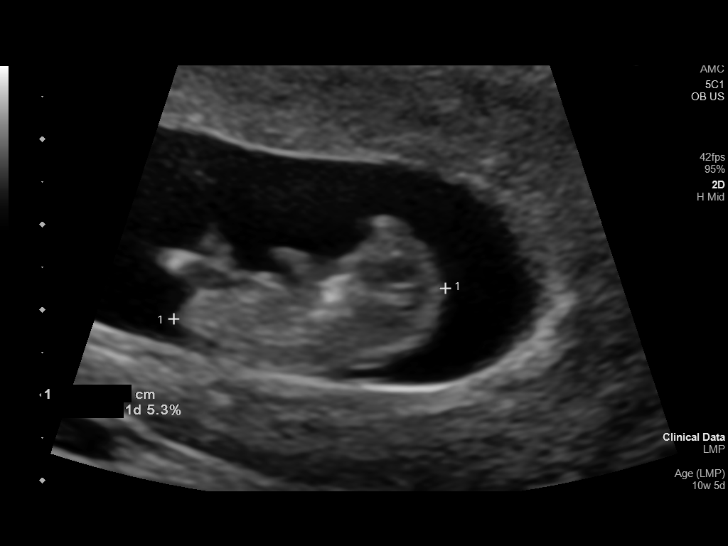
[im 8/22]
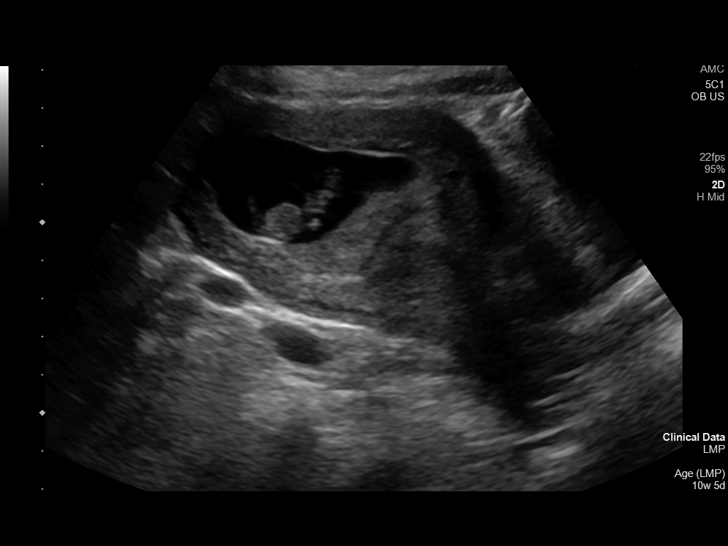
[im 9/22]
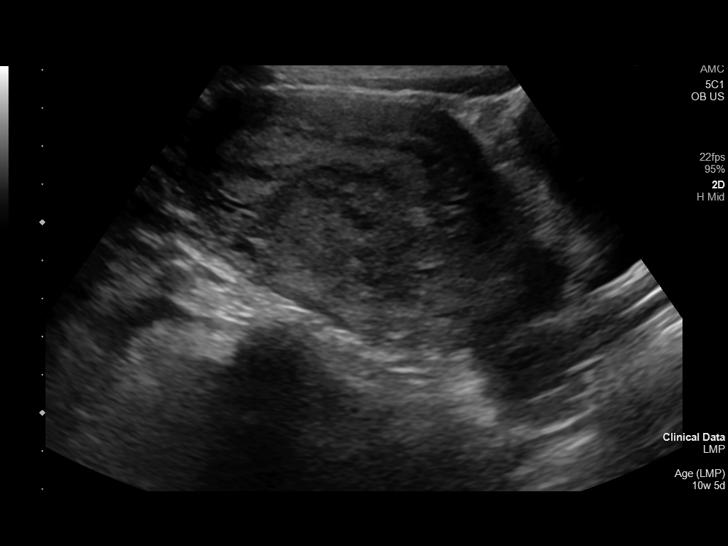
[im 11/22]
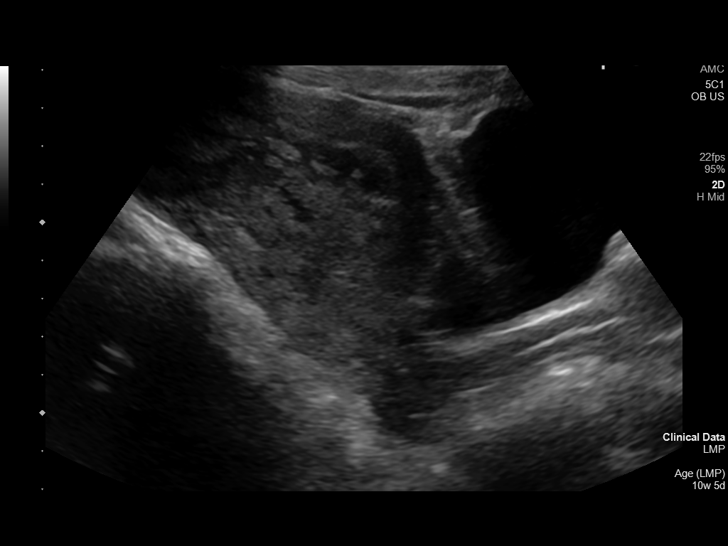
[im 12/22]
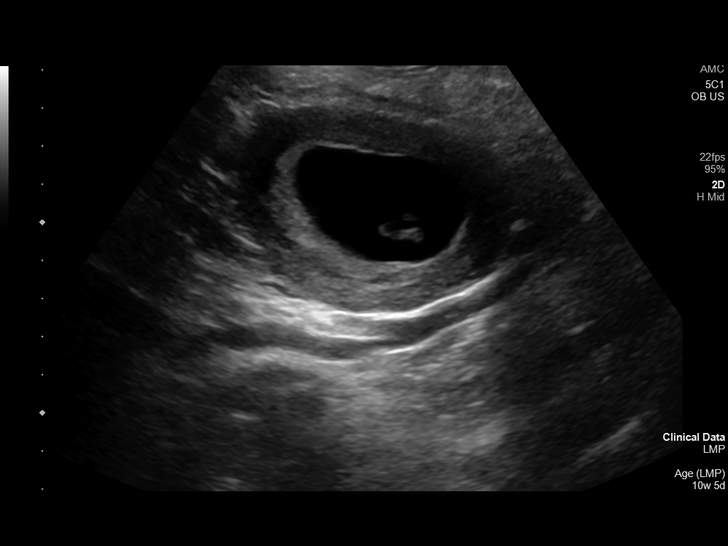
[im 14/22]
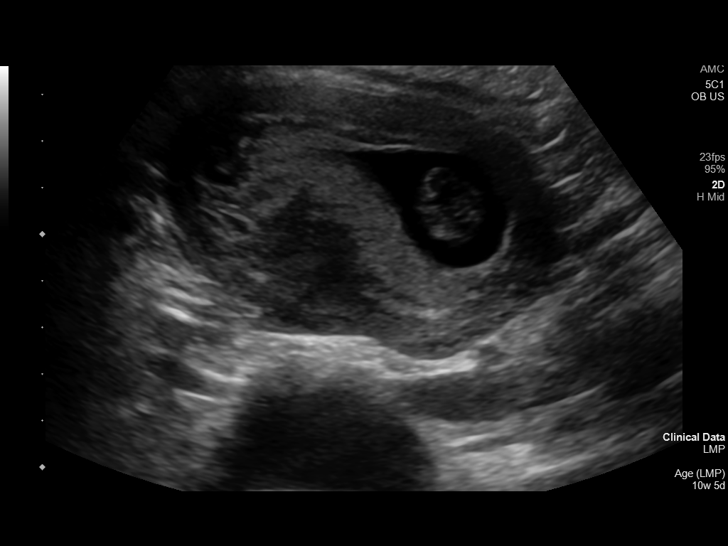
[im 15/22]
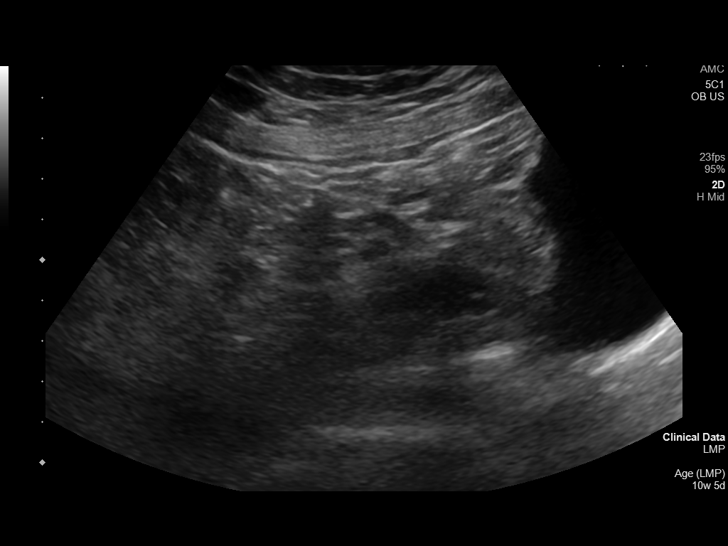
[im 17/22]
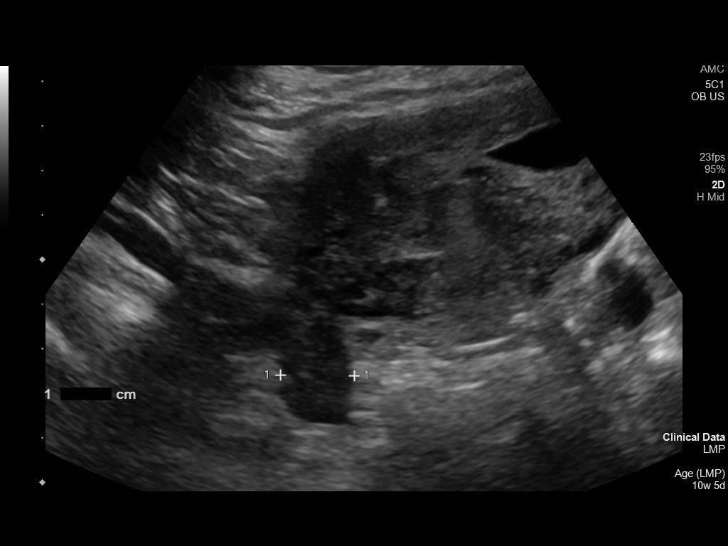
[im 19/22]
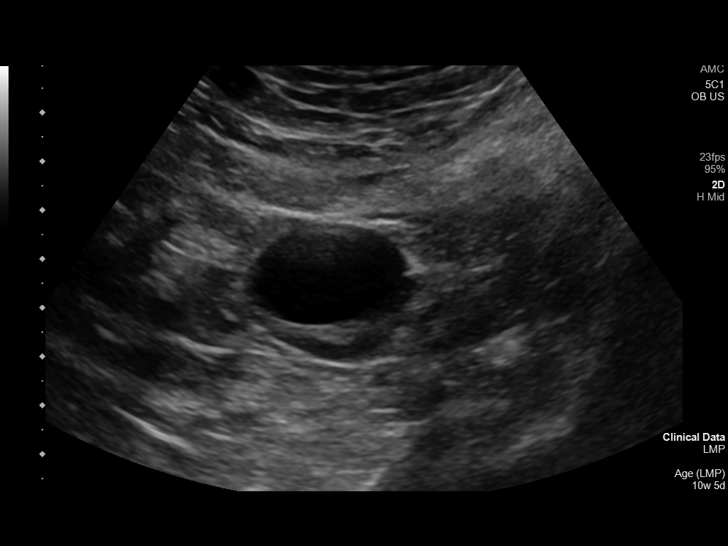
[im 20/22]
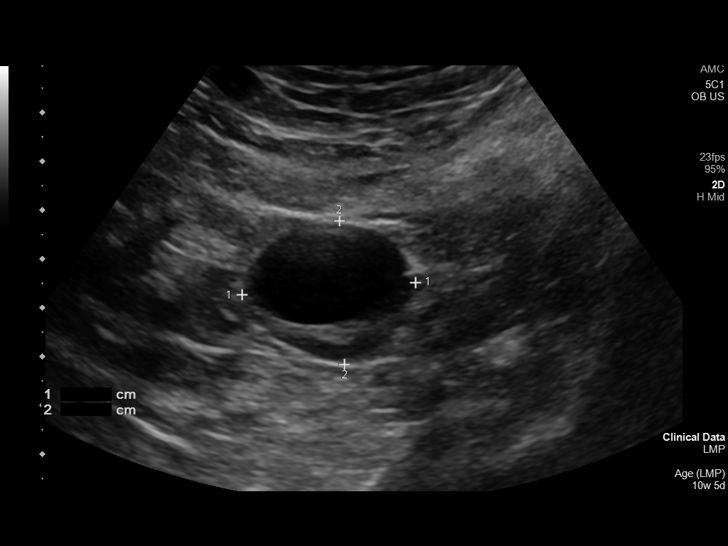
[im 22/22]
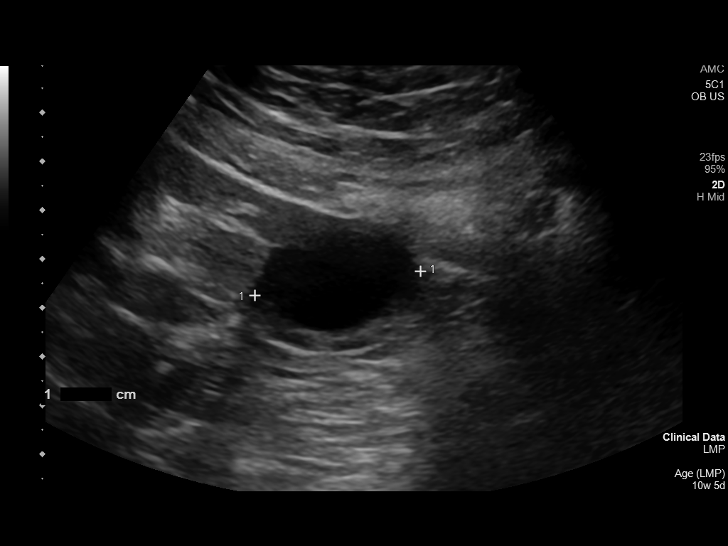

[14 of 22 positions shown; findings below may reference images not displayed]

FINDINGS: Intrauterine gestational sac: Single

Yolk sac:  Visualized.

Embryo:  Visualized.

Cardiac Activity: Visualized.

Heart Rate: 167 bpm

CRL:   30.0 mm   10 w 1 d                  US EDC: February 08, 2021

Subchorionic hemorrhage:  None visualized.

Maternal uterus/adnexae: The bilateral ovaries are visualized and
are normal in appearance.

There is no evidence of pelvic free fluid.
IMPRESSION: Single, viable intrauterine pregnancy at approximately 10 weeks and
1 day gestation by ultrasound evaluation.

## 2022-10-24 IMAGING — US US BREAST*R* LIMITED INC AXILLA
1 series · 12 of 12 positions shown · non-contrast
Comparison: 07/14/2020

CLINICAL DATA: Findings suspicious for breast abscess

EXAM:
ULTRASOUND OF THE RIGHT BREAST

[Series 1: us breast 10 minutes · 12 acquisitions, 12 frames shown]
[im 1/12]
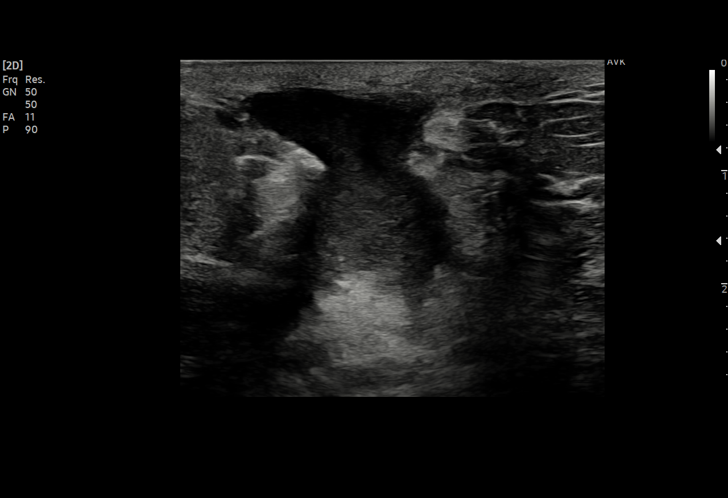
[im 2/12]
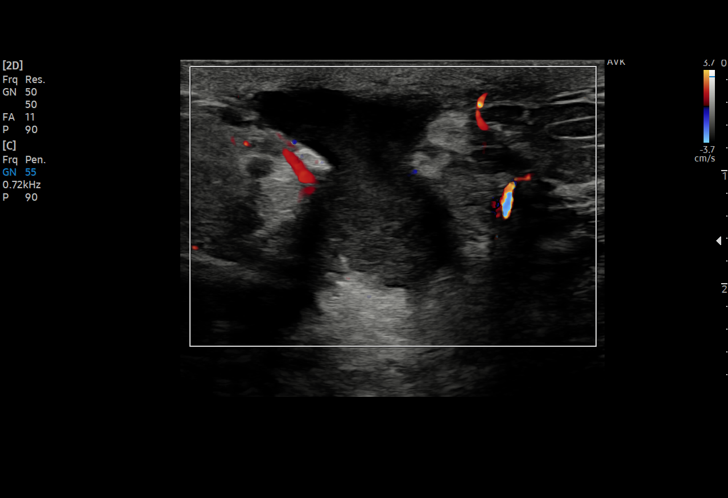
[im 3/12]
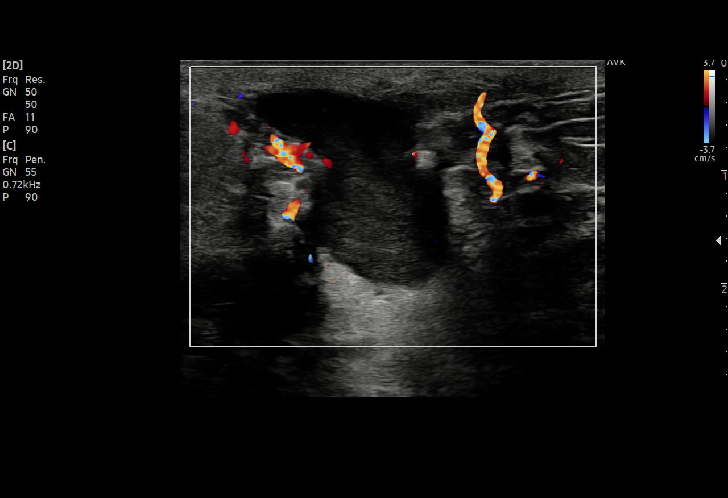
[im 4/12]
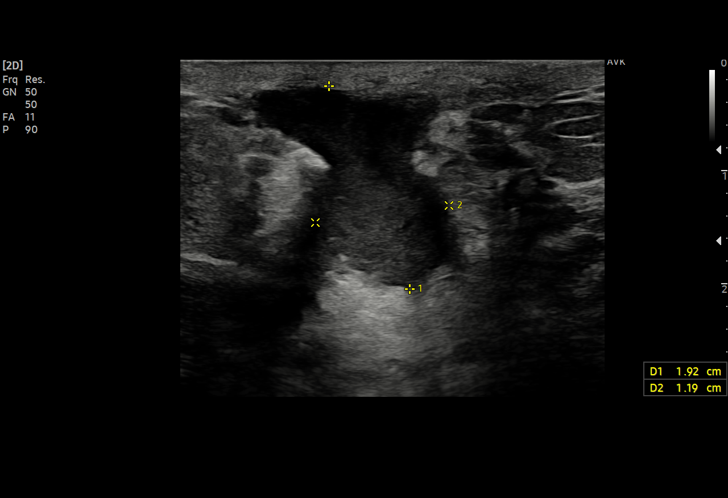
[im 5/12]
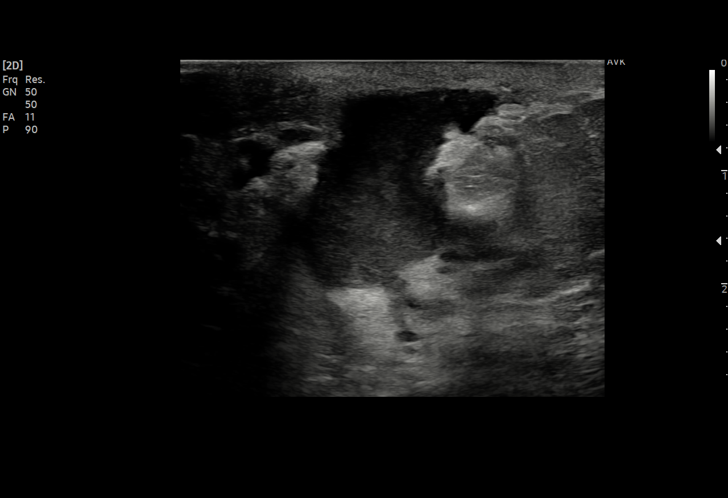
[im 6/12]
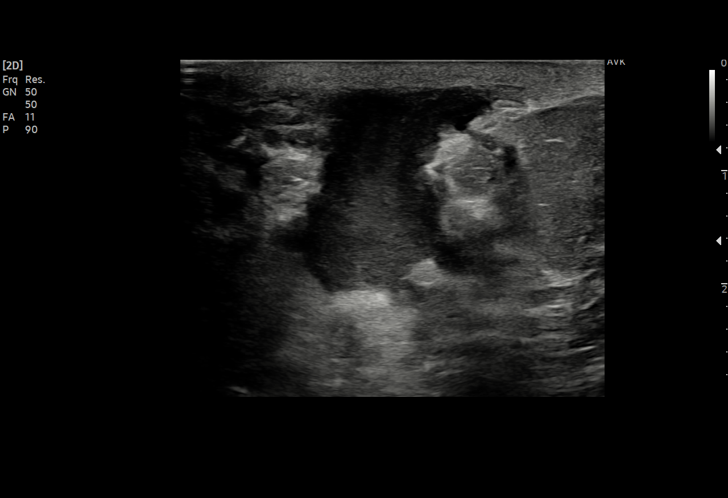
[im 7/12]
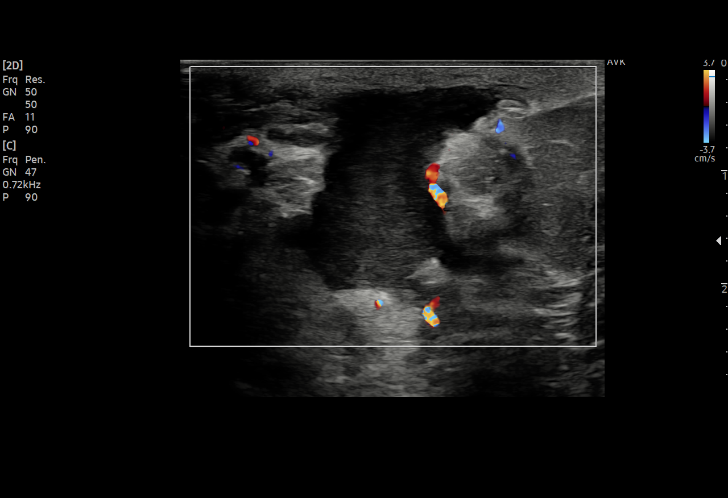
[im 8/12]
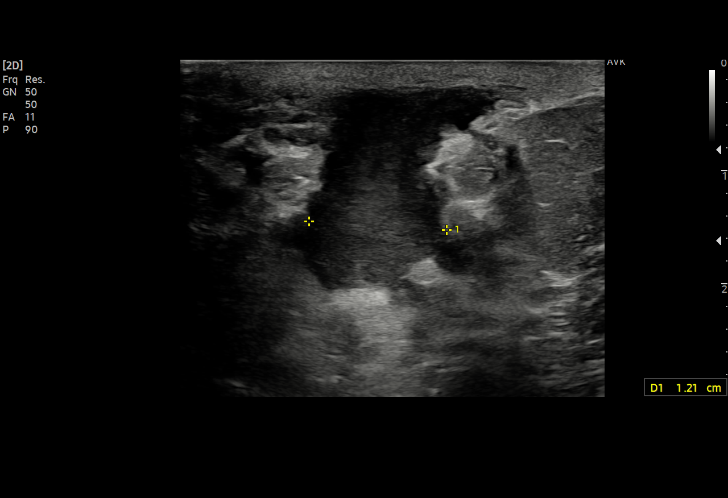
[im 9/12]
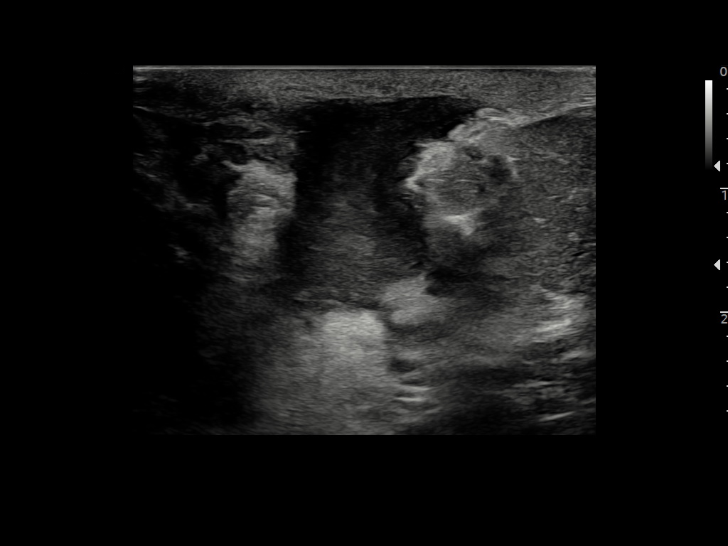
[im 10/12]
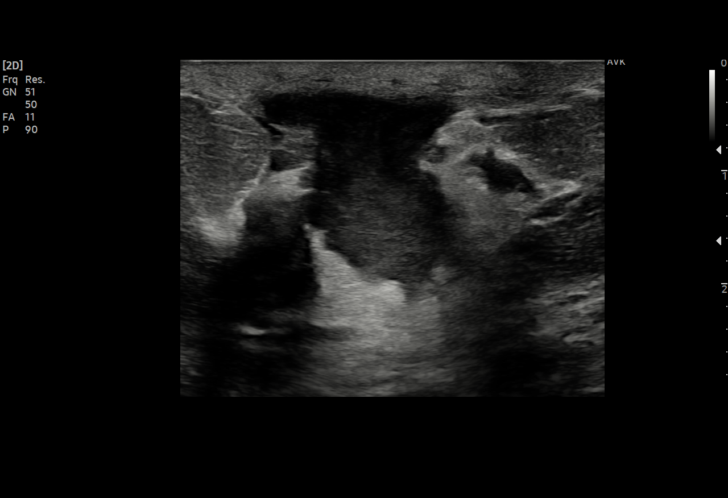
[im 11/12]
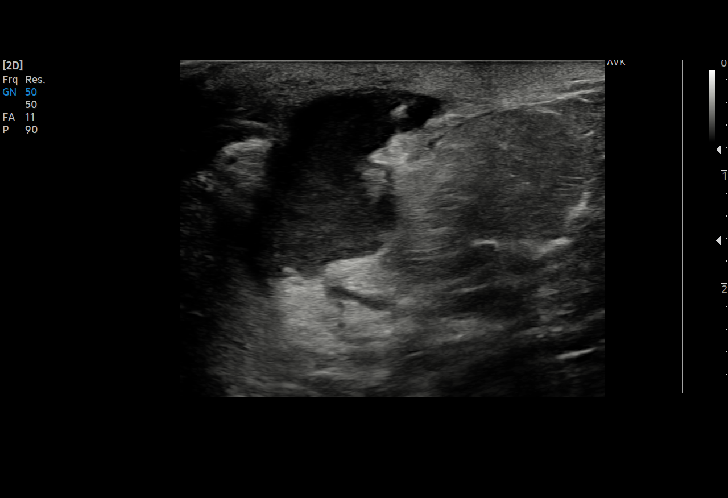
[im 12/12]
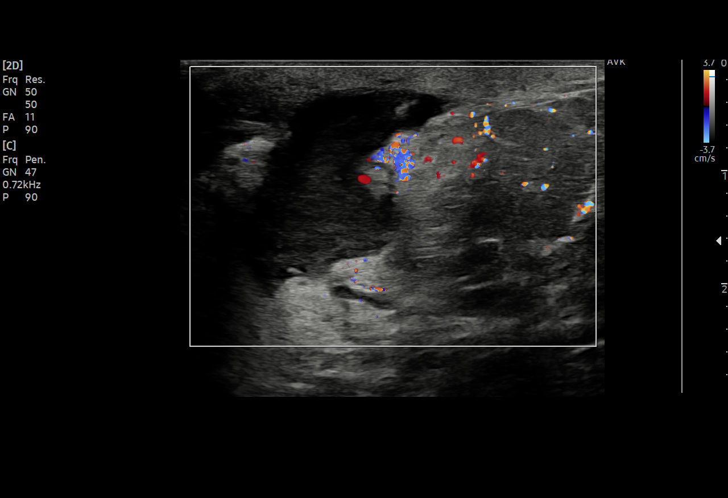

[12 of 12 positions shown; findings below may reference images not displayed]

FINDINGS: Targeted ultrasound is performed, showing a 1.9 x 1.2 x 1.2 cm
hypoechoic area with internal echoes likely representing a focal
abscess. This lies in the 6 o'clock position 1 cm from the nipple.
IMPRESSION: Findings consistent with a focal abscess in the right breast at the
6 o'clock position 1 cm from the nipple.

## 2022-12-01 ENCOUNTER — Encounter: Payer: Self-pay | Admitting: Emergency Medicine

## 2022-12-01 ENCOUNTER — Other Ambulatory Visit: Payer: Self-pay

## 2022-12-01 ENCOUNTER — Emergency Department
Admission: EM | Admit: 2022-12-01 | Discharge: 2022-12-01 | Disposition: A | Discharge status: Home / Self Care | Payer: BC Managed Care – PPO | Attending: Emergency Medicine | Admitting: Emergency Medicine

## 2022-12-01 DIAGNOSIS — R42 Dizziness and giddiness: Secondary | ICD-10-CM | POA: Diagnosis not present

## 2022-12-01 DIAGNOSIS — N61 Mastitis without abscess: Secondary | ICD-10-CM | POA: Insufficient documentation

## 2022-12-01 LAB — URINALYSIS, ROUTINE W REFLEX MICROSCOPIC
Bilirubin Urine: NEGATIVE
Glucose, UA: NEGATIVE mg/dL
Hgb urine dipstick: NEGATIVE
Ketones, ur: NEGATIVE mg/dL
Nitrite: NEGATIVE
Protein, ur: NEGATIVE mg/dL
Specific Gravity, Urine: 1.017 (ref 1.005–1.030)
pH: 5 (ref 5.0–8.0)

## 2022-12-01 LAB — COMPREHENSIVE METABOLIC PANEL
ALT: 10 U/L (ref 0–44)
AST: 16 U/L (ref 15–41)
Albumin: 4.3 g/dL (ref 3.5–5.0)
Alkaline Phosphatase: 49 U/L (ref 38–126)
Anion gap: 7 (ref 5–15)
BUN: 12 mg/dL (ref 6–20)
CO2: 25 mmol/L (ref 22–32)
Calcium: 9.1 mg/dL (ref 8.9–10.3)
Chloride: 107 mmol/L (ref 98–111)
Creatinine, Ser: 0.59 mg/dL (ref 0.44–1.00)
GFR, Estimated: >60 mL/min (ref >60)
Glucose, Bld: 94 mg/dL (ref 70–99)
Potassium: 3.7 mmol/L (ref 3.5–5.1)
Sodium: 139 mmol/L (ref 135–145)
Total Bilirubin: 0.7 mg/dL (ref 0.3–1.2)
Total Protein: 7.5 g/dL (ref 6.5–8.1)

## 2022-12-01 LAB — CBC WITH DIFFERENTIAL/PLATELET
Abs Immature Granulocytes: 0.02 10*3/uL (ref 0.00–0.07)
Basophils Absolute: 0 10*3/uL (ref 0.0–0.1)
Basophils Relative: 0 %
Eosinophils Absolute: 0.2 10*3/uL (ref 0.0–0.5)
Eosinophils Relative: 2 %
HCT: 38.7 % (ref 36.0–46.0)
Hemoglobin: 13.1 g/dL (ref 12.0–15.0)
Immature Granulocytes: 0 %
Lymphocytes Relative: 41 %
Lymphs Abs: 3 10*3/uL (ref 0.7–4.0)
MCH: 30.4 pg (ref 26.0–34.0)
MCHC: 33.9 g/dL (ref 30.0–36.0)
MCV: 89.8 fL (ref 80.0–100.0)
Monocytes Absolute: 0.6 10*3/uL (ref 0.1–1.0)
Monocytes Relative: 9 %
Neutro Abs: 3.5 10*3/uL (ref 1.7–7.7)
Neutrophils Relative %: 48 %
Platelets: 429 10*3/uL — ABNORMAL HIGH (ref 150–400)
RBC: 4.31 MIL/uL (ref 3.87–5.11)
RDW: 11.9 % (ref 11.5–15.5)
WBC: 7.4 10*3/uL (ref 4.0–10.5)
nRBC: 0 % (ref 0.0–0.2)

## 2022-12-01 LAB — TROPONIN I (HIGH SENSITIVITY): Troponin I (High Sensitivity): <2 ng/L (ref <18)

## 2022-12-01 LAB — POC URINE PREG, ED: Preg Test, Ur: NEGATIVE

## 2022-12-01 MED ORDER — MECLIZINE HCL 25 MG PO TABS
25.0000 mg | ORAL_TABLET | Freq: Once | ORAL | Status: AC
  Administered 2022-12-01: 25 mg via ORAL
  Filled 2022-12-01: qty 1

## 2022-12-01 MED ORDER — MECLIZINE HCL 25 MG PO TABS: 25.0000 mg | ORAL_TABLET | Freq: Three times a day (TID) | ORAL | 0 refills | Status: AC | PRN

## 2022-12-01 MED ORDER — CEPHALEXIN 500 MG PO CAPS
500.0000 mg | ORAL_CAPSULE | Freq: Once | ORAL | Status: AC
  Administered 2022-12-01: 500 mg via ORAL
  Filled 2022-12-01: qty 1

## 2022-12-01 MED ORDER — FLUCONAZOLE 150 MG PO TABS: 150.0000 mg | ORAL_TABLET | Freq: Every day | ORAL | 0 refills | Status: DC

## 2022-12-01 MED ORDER — CEPHALEXIN 500 MG PO CAPS: 500.0000 mg | ORAL_CAPSULE | Freq: Three times a day (TID) | ORAL | 0 refills | Status: DC

## 2022-12-01 NOTE — ED Triage Notes (Signed)
Pt presents ambulatory to triage via POV with complaints of dizziness x 1 hour. She notes waking up from sleep around 7 and had some dizziness similar to vertigo. Hx of vertigo several years ago. Pt notes breast feeding and stopped around Feb 2024 and has had some pus from her breast for the last several months but hasn't seen anyone about it. A&Ox4 at this time. Denies fevers, chills, CP or SOB.

## 2022-12-01 NOTE — Discharge Instructions (Signed)
Please take antibiotic as prescribed for its entire course.  Please take your meclizine as needed for dizziness but only as written.  Please call the number provided below for the [facility] breast center to arrange a follow-up appointment soon as possible.  Return to the emergency department for any fever any breast redness swelling pain or any other symptom concerning to yourself [place] [facility]: [phone]

## 2022-12-01 NOTE — ED Provider Notes (Signed)
[facility] Provider Note    Event Date/Time   First MD Initiated Contact with Patient 12/01/22 2216     (approximate)  History   Chief Complaint: Dizziness, breast discharge  HPI  Virginia Mitchell is a 32 y.o. female with a past medical history of anemia, vertigo, presents to the emergency department with multiple complaints.  Patient states she has noticed some discharge from the left breast and intermittently has some thicker discharge with occasional blood from the right breast.  Patient states she will occasionally have pain in the right breast as well.  Patient states this has been going on for quite some time since her last pregnancy.  Ended up seeing a breast specialist they attempted to drain an abscess at that time but said it was too thick to be drained they put the patient on antibiotics and eventually this largely resolved.  Patient states she stopped breast-feeding in February but continues to have discharge intermittently from both breasts but occasional blood from the right breast.  No bleeding currently.  No fever.  Patient also states that secondary complaint she has been experiencing intermittent dizziness recently.  Describes this more as a spinning sensation and states that history of vertigo to which this feels identical.  Physical Exam   Triage Vital Signs: ED Triage Vitals  Encounter Vitals Group     BP 12/01/22 2020 130/84     Systolic BP Percentile --      Diastolic BP Percentile --      Pulse Rate 12/01/22 2020 71     Resp 12/01/22 2020 18     Temp 12/01/22 2020 98.2 F (36.8 C)     Temp src --      SpO2 12/01/22 2020 100 %     Weight 12/01/22 2022 170 lb (77.1 kg)     Height 12/01/22 2020 5' (1.524 m)     Head Circumference --      Peak Flow --      Pain Score --      Pain Loc --      Pain Education --      Exclude from Growth Chart --     Most recent vital signs: Vitals:   12/01/22 2020  BP: 130/84  Pulse: 71  Resp:  18  Temp: 98.2 F (36.8 C)  SpO2: 100%    General: Awake, no distress.  CV:  Good peripheral perfusion.  Regular rate and rhythm  Resp:  Normal effort.  Equal breath sounds bilaterally.  Abd:  No distention.  Soft, nontender.  No rebound or guarding. Other:  Patient's left breast appears normal no obvious abscess or cellulitis no erythema.  Patient is able to palpate the breast and expressed some what appears to be consistent with milk from the left nipple.  No obvious signs of infection. The right breast is nonerythematous no abscess palpated.  Patient is able to express some discharge from the right breast.  It is much thicker but still appears most consistent with lactation however the patient states that it is often times bloody and at times painful we will cover with antibiotics.   ED Results / Procedures / Treatments   MEDICATIONS ORDERED IN ED: Medications  cephALEXin (KEFLEX) capsule 500 mg (has no administration in time range)  meclizine (ANTIVERT) tablet 25 mg (has no administration in time range)     IMPRESSION / MDM / ASSESSMENT AND PLAN / ED COURSE  I reviewed the triage vital signs  and the nursing notes.  Patient's presentation is most consistent with acute presentation with potential threat to life or bodily function.  Patient's breast exam is most consistent with residual lactation however given the patient's description of intermittent blood from the right breast with intermittent pain we will cover with Keflex for possible mastitis.  Patient is no longer breast-feeding.  States she was referred to the breast center during her pregnancy however as she was actively pregnant they did not do any additional imaging or treatment at that time per patient.  Will refer the patient back to the breast center as she is no longer breast-feeding or pregnant.  We will cover with Keflex for possible mastitis.  No sign of cellulitis significant infection or abscess however on exam.   Patient's lab work is reassuring with a normal CBC with a normal white blood cell count, normal chemistry, troponin is negative pregnancy test negative and urinalysis shows no concerning finding.  Will prescribe meclizine for the patient's intermittent dizziness.  Patient to follow-up with the breast center and her PCP.  FINAL CLINICAL IMPRESSION(S) / ED DIAGNOSES   Mastitis Dizziness  Rx / DC Orders   Keflex Meclizine Diflucan, as the patient states she is prone to yeast infections with antibiotics.  Note:  This document was prepared using Dragon voice recognition software and may include unintentional dictation errors.   Minna Antis, MD 12/01/22 2249

## 2023-01-27 NOTE — H&P (Signed)
Virginia Mitchell is a 32 y.o. female presenting with Pre Op Consulting   History of Present Illness: Patient returns today for preop for hysterectomy for her pelvic pain, possible hydrosalpinx, ovarian cysts.   She has failed nexplanon (will leave in place), depo with heavy bleeding, OCPs and the progesterone IUD with daily bleeding.    EMBx never    Last pap 05/2019 neg/neg, collected today    12/05/22 CT abdomen & pelvis  Mild bilateral hydrosalpinx. Recommend further evaluation with pelvic ultrasound.    12/05/22 TVUS  Uterus 7.9 x 4 x 5.3 cm  Endometrium 3 mm  Bilateral ovaries with small cystic areas, compatible with follicles    1. Trace fluid or hemorrhagic products in the endometrial canal. No endometrial hyperemia.  2. Anterior uterine fibroid (2.5 cm).    Pertinent Hx: - Nexplanon inserted 03/2021  - SVD x 3   Past Medical History:  has a past medical history of Anemia, Migraine headache (2016), Nausea & vomiting (10/232020), and Thalassemia.  Past Surgical History:  has a past surgical history that includes Dilation and curettage of uterus. Family History: family history includes Asthma in her paternal grandmother; Diabetes in her paternal grandmother; Migraines in her paternal grandmother. Social History:  reports that she has been smoking. She has never used smokeless tobacco. She reports that she does not currently use alcohol. She reports that she does not currently use drugs. OB/GYN History:  OB History       Gravida 7   Para 3   Term 3   Preterm     AB 4   Living 3       SAB 3   IAB     Ectopic     Molar     Multiple     Live Births 3        Allergies: is allergic to latex, sulfasalazine, penicillins, and sulfur (not sulfa). Medications: Current Medications Current Outpatient Medications:    etonogestreL (NEXPLANON) 68 mg implant, Inject 1 each into the skin once, Disp: , Rfl:    acetaminophen (TYLENOL) 500 MG tablet, Take  by mouth (Patient not taking: Reported on 02/25/2021), Disp: , Rfl:    aspirin 81 MG EC tablet, Take 1 tablet (81 mg total) by mouth once daily (Patient not taking: Reported on 01/21/2021), Disp: 90 tablet, Rfl: 0   aspirin-caffeine 845-65 mg PwPk, Take by mouth (Patient not taking: Reported on 08/21/2020), Disp: , Rfl:    ibuprofen (MOTRIN) 200 MG tablet, Take 200 mg by mouth every 6 (six) hours as needed for Pain (Patient not taking: Reported on 08/21/2020), Disp: , Rfl:    PNV no.95/ferrous fum/folic ac (PRENATAL ORAL), Take by mouth (Patient not taking: Reported on 02/25/2021), Disp: , Rfl:     Review of Systems: No SOB, no palpitations or chest pain, no new lower extremity edema, no nausea or vomiting or bowel or bladder complaints. See HPI for gyn specific ROS.    Exam:   BP 114/77   Pulse 84   Ht 152.4 cm (5')   Wt 77.8 kg (171 lb 9.6 oz)   LMP 12/04/2022 (Approximate)   Breastfeeding No   BMI 33.51 kg/m      Constitutional:  General appearance: Well nourished, well developed female in no acute distress.  Neuro/psych:  Normal mood and affect. No gross motor deficits. Neck:  Supple, normal appearance.  CV: RRR Pulm: CTAB Respiratory:  Normal respiratory effort, no use of accessory muscles Skin:  No visible rashes  or external lesions    Pelvic:              External genitalia: vulva and labia without lesions, Tanner stage 5             Urethra: no prolapse             Vagina: normal physiologic d/c             Cervix: no lesions, no cervical motion tenderness               Uterus: normal size shape and contour, non-tender   Pap collected today  Wet prep  Endometrial biopsy: The cervix was cleaned with betadine, topical Hurriciane spray applied, and a single tooth tenaculum is applied to the anterior cervix. The Pipelle catheter was placed into the endometrial cavity. It sounds to 7.5 cm and adequate tissue was removed.     A female chaperone was present for the more  sensitive portions of the physical exam ( such as breast and pelvic)    Impression:   The primary encounter diagnosis was Cervical cancer screening. Diagnoses of Pelvic pain in female, Hydrosalpinx, Abnormal uterine bleeding (AUB), and Vaginal discharge were also pertinent to this visit.   Plan:   1. Preop:  -Patient returns for a preoperative discussion regarding her plans to proceed with surgical treatment of her AUB, pelvic pain & possible hydrosalpinx by RA total laparoscopic hysterectomy with bilateral salpingectomy with Nexplanon removal and possible perineal repair.  We may perform a cystoscopy to evaluate the urinary tract after the procedure, if surgically indicated for uro tract integrity.    The patient and I discussed the technical aspects of the procedure including the potential for risks and complications. These include but are not limited to the risk of infection requiring post-operative antibiotics or further procedures. We talked about the risk of injury to adjacent organs including bladder, bowel, ureter, blood vessels or nerves. We talked about the need to convert to an open incision. We talked about the possible need for blood transfusion.  We talked about postop complications such as thromboembolic or cardiopulmonary complications.  All of her questions were answered.  Her preoperative exam was completed and the appropriate consents were signed. She is scheduled to undergo this procedure in the near future.   Plan to remove her Nexplanon in the OR

## 2023-01-28 ENCOUNTER — Encounter
Admission: RE | Admit: 2023-01-28 | Discharge: 2023-01-28 | Disposition: A | Discharge status: Home / Self Care | Payer: BC Managed Care – PPO | Source: Ambulatory Visit | Attending: Obstetrics and Gynecology | Admitting: Obstetrics and Gynecology

## 2023-01-28 ENCOUNTER — Other Ambulatory Visit: Payer: Self-pay

## 2023-01-28 DIAGNOSIS — Z01812 Encounter for preprocedural laboratory examination: Secondary | ICD-10-CM

## 2023-01-28 HISTORY — DX: Anxiety disorder, unspecified: F41.9

## 2023-01-28 HISTORY — DX: Chronic salpingitis: N70.11

## 2023-01-28 HISTORY — DX: Dizziness and giddiness: R42

## 2023-01-28 HISTORY — DX: Pelvic and perineal pain: R10.2

## 2023-01-28 HISTORY — DX: Headache, unspecified: R51.9

## 2023-01-28 HISTORY — DX: Leiomyoma of uterus, unspecified: D25.9

## 2023-01-28 HISTORY — DX: Thalassemia minor: D56.3

## 2023-01-28 HISTORY — DX: Unspecified ovarian cyst, unspecified side: N83.209

## 2023-01-28 HISTORY — DX: COVID-19: U07.1

## 2023-01-28 NOTE — Patient Instructions (Addendum)
Your procedure is scheduled on: 02/04/33 - Thursday Report to the Registration Desk on the 1st floor of the [facility]. To find out your arrival time, please call [phone] between 1PM - 3PM on: 02/04/23 - Wednesday If your arrival time is 6:00 am, do not arrive before that time as the [facility] entrance doors do not open until 6:00 am.  REMEMBER: Instructions that are not followed completely may result in serious medical risk, up to and including death; or upon the discretion of your surgeon and anesthesiologist your surgery may need to be rescheduled.  Do not eat food after midnight the night before surgery.  No gum chewing or hard candies.  You may however, drink CLEAR liquids up to 2 hours before you are scheduled to arrive for your surgery. Do not drink anything within 2 hours of your scheduled arrival time.  Clear liquids include: - water  - apple juice without pulp - gatorade (not RED colors) - black coffee or tea (Do NOT add milk or creamers to the coffee or tea) Do NOT drink anything that is not on this list.   One week prior to surgery: Stop Anti-inflammatories (NSAIDS) such as Advil, Aleve, Ibuprofen, Motrin, Naproxen, Naprosyn and Aspirin based products such as Excedrin, Goody's Powder, BC Powder. You may however, continue to take Tylenol if needed for pain up until the day of surgery.  Stop ANY OVER THE COUNTER supplements until after surgery.   TAKE ONLY THESE MEDICATIONS THE MORNING OF SURGERY WITH A SIP OF WATER:  none   No Alcohol for 24 hours before or after surgery.  No Smoking including e-cigarettes for 24 hours before surgery.  No chewable tobacco products for at least 6 hours before surgery.  No nicotine patches on the day of surgery.  Do not use any "recreational" drugs for at least a week (preferably 2 weeks) before your surgery.  Please be advised that the combination of cocaine and anesthesia may have negative outcomes, up to and including  death. If you test positive for cocaine, your surgery will be cancelled.  On the morning of surgery brush your teeth with toothpaste and water, you may rinse your mouth with mouthwash if you wish. Do not swallow any toothpaste or mouthwash.  Use CHG Soap or wipes as directed on instruction sheet.  Do not wear jewelry, make-up, hairpins, clips or nail polish.  For welded (permanent) jewelry: bracelets, anklets, waist bands, etc.  Please have this removed prior to surgery.  If it is not removed, there is a chance that hospital personnel will need to cut it off on the day of surgery.  Do not wear lotions, powders, or perfumes.   Do not shave body hair from the neck down 48 hours before surgery.  Contact lenses, hearing aids and dentures may not be worn into surgery.  Do not bring valuables to the hospital. [facility] is not responsible for any missing/lost belongings or valuables.   Notify your doctor if there is any change in your medical condition (cold, fever, infection).  Wear comfortable clothing (specific to your surgery type) to the hospital.  After surgery, you can help prevent lung complications by doing breathing exercises.  Take deep breaths and cough every 1-2 hours. Your doctor may order a device called an Incentive Spirometer to help you take deep breaths. When coughing or sneezing, hold a pillow firmly against your incision with both hands. This is called "splinting." Doing this helps protect your incision. It also decreases  belly discomfort.  If you are being admitted to the hospital overnight, leave your suitcase in the car. After surgery it may be brought to your room.  In case of increased patient census, it may be necessary for you, the patient, to continue your postoperative care in the Same Day Surgery department.  If you are being discharged the day of surgery, you will not be allowed to drive home. You will need a responsible individual to drive you home and  stay with you for 24 hours after surgery.   If you are taking public transportation, you will need to have a responsible individual with you.  Please call the Pre-admissions Testing Dept. at [phone] if you have any questions about these instructions.  Surgery Visitation Policy:  Patients having surgery or a procedure may have two visitors.  Children under the age of 65 must have an adult with them who is not the patient.  Inpatient Visitation:    Visiting hours are 7 a.m. to 8 p.m. Up to four visitors are allowed at one time in a patient room. The visitors may rotate out with other people during the day.  One visitor age 38 or older may stay with the patient overnight and must be in the room by 8 p.m.     Preparing for Surgery with CHLORHEXIDINE GLUCONATE (CHG) Soap  Chlorhexidine Gluconate (CHG) Soap  o An antiseptic cleaner that kills germs and bonds with the skin to continue killing germs even after washing  o Used for showering the night before surgery and morning of surgery  Before surgery, you can play an important role by reducing the number of germs on your skin.  CHG (Chlorhexidine gluconate) soap is an antiseptic cleanser which kills germs and bonds with the skin to continue killing germs even after washing.  Please do not use if you have an allergy to CHG or antibacterial soaps. If your skin becomes reddened/irritated stop using the CHG.  1. Shower the NIGHT BEFORE SURGERY and the MORNING OF SURGERY with CHG soap.  2. If you choose to wash your hair, wash your hair first as usual with your normal shampoo.  3. After shampooing, rinse your hair and body thoroughly to remove the shampoo.  4. Use CHG as you would any other liquid soap. You can apply CHG directly to the skin and wash gently with a scrungie or a clean washcloth.  5. Apply the CHG soap to your body only from the neck down. Do not use on open wounds or open sores. Avoid contact with your eyes, ears,  mouth, and genitals (private parts). Wash face and genitals (private parts) with your normal soap.  6. Wash thoroughly, paying special attention to the area where your surgery will be performed.  7. Thoroughly rinse your body with warm water.  8. Do not shower/wash with your normal soap after using and rinsing off the CHG soap.  9. Pat yourself dry with a clean towel.  10. Wear clean pajamas to bed the night before surgery.  12. Place clean sheets on your bed the night of your first shower and do not sleep with pets.  13. Shower again with the CHG soap on the day of surgery prior to arriving at the hospital.  14. Do not apply any deodorants/lotions/powders.  15. Please wear clean clothes to the hospital.

## 2023-01-29 ENCOUNTER — Encounter
Admission: RE | Admit: 2023-01-29 | Discharge: 2023-01-29 | Disposition: A | Discharge status: Home / Self Care | Payer: BC Managed Care – PPO | Source: Ambulatory Visit | Attending: Obstetrics and Gynecology | Admitting: Obstetrics and Gynecology

## 2023-01-29 ENCOUNTER — Other Ambulatory Visit: Payer: BC Managed Care – PPO

## 2023-01-29 DIAGNOSIS — R102 Pelvic and perineal pain: Secondary | ICD-10-CM | POA: Diagnosis not present

## 2023-01-29 DIAGNOSIS — Z01812 Encounter for preprocedural laboratory examination: Secondary | ICD-10-CM | POA: Insufficient documentation

## 2023-01-29 DIAGNOSIS — Z01818 Encounter for other preprocedural examination: Secondary | ICD-10-CM | POA: Diagnosis present

## 2023-01-29 LAB — CBC
HCT: 39.9 % (ref 36.0–46.0)
Hemoglobin: 13 g/dL (ref 12.0–15.0)
MCH: 30.1 pg (ref 26.0–34.0)
MCHC: 32.6 g/dL (ref 30.0–36.0)
MCV: 92.4 fL (ref 80.0–100.0)
Platelets: 436 10*3/uL — ABNORMAL HIGH (ref 150–400)
RBC: 4.32 MIL/uL (ref 3.87–5.11)
RDW: 12.4 % (ref 11.5–15.5)
WBC: 9.7 10*3/uL (ref 4.0–10.5)
nRBC: 0 % (ref 0.0–0.2)

## 2023-01-29 LAB — TYPE AND SCREEN
ABO/RH(D): O POS
Antibody Screen: NEGATIVE

## 2023-02-05 ENCOUNTER — Other Ambulatory Visit: Payer: Self-pay | Admitting: Obstetrics and Gynecology

## 2023-02-05 ENCOUNTER — Encounter
Admission: RE | Disposition: A | Discharge status: Home / Self Care | Payer: Self-pay | Source: Home / Self Care | Attending: Obstetrics and Gynecology

## 2023-02-05 ENCOUNTER — Ambulatory Visit: Payer: BC Managed Care – PPO | Admitting: Certified Registered"

## 2023-02-05 ENCOUNTER — Ambulatory Visit: Payer: BC Managed Care – PPO | Admitting: Urgent Care

## 2023-02-05 ENCOUNTER — Ambulatory Visit
Admission: RE | Admit: 2023-02-05 | Discharge: 2023-02-05 | Disposition: A | Discharge status: Home / Self Care | Payer: BC Managed Care – PPO | Attending: Obstetrics and Gynecology | Admitting: Obstetrics and Gynecology

## 2023-02-05 ENCOUNTER — Encounter: Payer: Self-pay | Admitting: Obstetrics and Gynecology

## 2023-02-05 DIAGNOSIS — N7011 Chronic salpingitis: Secondary | ICD-10-CM | POA: Insufficient documentation

## 2023-02-05 DIAGNOSIS — R102 Pelvic and perineal pain: Secondary | ICD-10-CM | POA: Insufficient documentation

## 2023-02-05 DIAGNOSIS — N939 Abnormal uterine and vaginal bleeding, unspecified: Secondary | ICD-10-CM | POA: Insufficient documentation

## 2023-02-05 DIAGNOSIS — F17200 Nicotine dependence, unspecified, uncomplicated: Secondary | ICD-10-CM | POA: Diagnosis not present

## 2023-02-05 DIAGNOSIS — N841 Polyp of cervix uteri: Secondary | ICD-10-CM | POA: Insufficient documentation

## 2023-02-05 DIAGNOSIS — Z01812 Encounter for preprocedural laboratory examination: Secondary | ICD-10-CM

## 2023-02-05 HISTORY — PX: REMOVAL OF DRUG DELIVERY IMPLANT: SHX6585

## 2023-02-05 HISTORY — PX: ROBOTIC ASSISTED LAPAROSCOPIC HYSTERECTOMY AND SALPINGECTOMY: SHX6379

## 2023-02-05 HISTORY — PX: CYSTOSCOPY: SHX5120

## 2023-02-05 LAB — POCT PREGNANCY, URINE: Preg Test, Ur: NEGATIVE

## 2023-02-05 SURGERY — XI ROBOTIC ASSISTED LAPAROSCOPIC HYSTERECTOMY AND SALPINGECTOMY
Anesthesia: General | Site: Pelvis

## 2023-02-05 MED ORDER — DEXAMETHASONE SODIUM PHOSPHATE 10 MG/ML IJ SOLN
INTRAMUSCULAR | Status: AC
  Filled 2023-02-05: qty 1

## 2023-02-05 MED ORDER — GABAPENTIN 300 MG PO CAPS
300.0000 mg | ORAL_CAPSULE | ORAL | Status: AC
  Administered 2023-02-05: 300 mg via ORAL

## 2023-02-05 MED ORDER — MIDAZOLAM HCL 2 MG/2ML IJ SOLN
INTRAMUSCULAR | Status: AC
  Filled 2023-02-05: qty 2

## 2023-02-05 MED ORDER — IBUPROFEN 800 MG PO TABS
800.0000 mg | ORAL_TABLET | Freq: Three times a day (TID) | ORAL | 1 refills | Status: AC
Start: 2023-02-05 — End: 2023-02-08

## 2023-02-05 MED ORDER — ONDANSETRON HCL 4 MG/2ML IJ SOLN
INTRAMUSCULAR | Status: AC
  Filled 2023-02-05: qty 2

## 2023-02-05 MED ORDER — CHLORHEXIDINE GLUCONATE 0.12 % MT SOLN
OROMUCOSAL | Status: AC
  Filled 2023-02-05: qty 15

## 2023-02-05 MED ORDER — DOCUSATE SODIUM 100 MG PO CAPS: 100.0000 mg | ORAL_CAPSULE | Freq: Two times a day (BID) | ORAL | 0 refills | Status: AC

## 2023-02-05 MED ORDER — ONDANSETRON HCL 4 MG/2ML IJ SOLN
INTRAMUSCULAR | Status: DC | PRN
  Administered 2023-02-05: 4 mg via INTRAVENOUS

## 2023-02-05 MED ORDER — PROPOFOL 10 MG/ML IV BOLUS
INTRAVENOUS | Status: DC | PRN
  Administered 2023-02-05: 100 mg via INTRAVENOUS

## 2023-02-05 MED ORDER — CEFAZOLIN SODIUM-DEXTROSE 2-4 GM/100ML-% IV SOLN
INTRAVENOUS | Status: AC
  Filled 2023-02-05: qty 100

## 2023-02-05 MED ORDER — FENTANYL CITRATE (PF) 100 MCG/2ML IJ SOLN
INTRAMUSCULAR | Status: AC
  Filled 2023-02-05: qty 2

## 2023-02-05 MED ORDER — LACTATED RINGERS IV SOLN: INTRAVENOUS | Status: DC

## 2023-02-05 MED ORDER — ROCURONIUM BROMIDE 100 MG/10ML IV SOLN
INTRAVENOUS | Status: DC | PRN
  Administered 2023-02-05: 60 mg via INTRAVENOUS
  Administered 2023-02-05: 10 mg via INTRAVENOUS

## 2023-02-05 MED ORDER — ACETAMINOPHEN 500 MG PO TABS
1000.0000 mg | ORAL_TABLET | ORAL | Status: AC
  Administered 2023-02-05: 1000 mg via ORAL

## 2023-02-05 MED ORDER — GABAPENTIN 800 MG PO TABS: 800.0000 mg | ORAL_TABLET | Freq: Every day | ORAL | 0 refills | Status: AC

## 2023-02-05 MED ORDER — ORAL CARE MOUTH RINSE: 15.0000 mL | Freq: Once | OROMUCOSAL | Status: AC

## 2023-02-05 MED ORDER — GABAPENTIN 300 MG PO CAPS
ORAL_CAPSULE | ORAL | Status: AC
  Filled 2023-02-05: qty 1

## 2023-02-05 MED ORDER — PROPOFOL 1000 MG/100ML IV EMUL
INTRAVENOUS | Status: AC
  Filled 2023-02-05: qty 300

## 2023-02-05 MED ORDER — ROCURONIUM BROMIDE 10 MG/ML (PF) SYRINGE
PREFILLED_SYRINGE | INTRAVENOUS | Status: AC
  Filled 2023-02-05: qty 10

## 2023-02-05 MED ORDER — OXYCODONE HCL 5 MG PO TABS
5.0000 mg | ORAL_TABLET | ORAL | 0 refills | Status: AC | PRN
Start: 2023-02-05 — End: ?

## 2023-02-05 MED ORDER — ACETAMINOPHEN 500 MG PO TABS
ORAL_TABLET | ORAL | Status: AC
  Filled 2023-02-05: qty 2

## 2023-02-05 MED ORDER — OXYCODONE HCL 5 MG PO TABS
5.0000 mg | ORAL_TABLET | Freq: Once | ORAL | Status: AC | PRN
  Administered 2023-02-05: 5 mg via ORAL

## 2023-02-05 MED ORDER — FAMOTIDINE 20 MG PO TABS
20.0000 mg | ORAL_TABLET | Freq: Once | ORAL | Status: AC
  Administered 2023-02-05: 20 mg via ORAL

## 2023-02-05 MED ORDER — EPHEDRINE 5 MG/ML INJ
INTRAVENOUS | Status: AC
  Filled 2023-02-05: qty 5

## 2023-02-05 MED ORDER — CHLORHEXIDINE GLUCONATE 0.12 % MT SOLN
15.0000 mL | Freq: Once | OROMUCOSAL | Status: AC
  Administered 2023-02-05: 15 mL via OROMUCOSAL

## 2023-02-05 MED ORDER — FENTANYL CITRATE (PF) 100 MCG/2ML IJ SOLN
25.0000 ug | INTRAMUSCULAR | Status: DC | PRN
  Administered 2023-02-05: 25 ug via INTRAVENOUS
  Administered 2023-02-05: 50 ug via INTRAVENOUS
  Administered 2023-02-05: 25 ug via INTRAVENOUS

## 2023-02-05 MED ORDER — OXYCODONE HCL 5 MG PO TABS
ORAL_TABLET | ORAL | Status: AC
  Filled 2023-02-05: qty 1

## 2023-02-05 MED ORDER — ACETAMINOPHEN EXTRA STRENGTH 500 MG PO TABS: 1000.0000 mg | ORAL_TABLET | Freq: Four times a day (QID) | ORAL | 0 refills | Status: AC

## 2023-02-05 MED ORDER — DEXMEDETOMIDINE HCL IN NACL 80 MCG/20ML IV SOLN
INTRAVENOUS | Status: AC
  Filled 2023-02-05: qty 20

## 2023-02-05 MED ORDER — EPHEDRINE SULFATE-NACL 50-0.9 MG/10ML-% IV SOSY
PREFILLED_SYRINGE | INTRAVENOUS | Status: DC | PRN
  Administered 2023-02-05: 10 mg via INTRAVENOUS

## 2023-02-05 MED ORDER — POVIDONE-IODINE 10 % EX SWAB
2.0000 | Freq: Once | CUTANEOUS | Status: AC
  Administered 2023-02-05: 2 via TOPICAL

## 2023-02-05 MED ORDER — OXYCODONE HCL 5 MG/5ML PO SOLN: 5.0000 mg | Freq: Once | ORAL | Status: AC | PRN

## 2023-02-05 MED ORDER — PHENYLEPHRINE 80 MCG/ML (10ML) SYRINGE FOR IV PUSH (FOR BLOOD PRESSURE SUPPORT)
PREFILLED_SYRINGE | INTRAVENOUS | Status: DC | PRN
  Administered 2023-02-05 (×7): 80 ug via INTRAVENOUS

## 2023-02-05 MED ORDER — PHENYLEPHRINE 80 MCG/ML (10ML) SYRINGE FOR IV PUSH (FOR BLOOD PRESSURE SUPPORT)
PREFILLED_SYRINGE | INTRAVENOUS | Status: AC
  Filled 2023-02-05: qty 10

## 2023-02-05 MED ORDER — LIDOCAINE HCL (CARDIAC) PF 100 MG/5ML IV SOSY
PREFILLED_SYRINGE | INTRAVENOUS | Status: DC | PRN
  Administered 2023-02-05: 80 mg via INTRAVENOUS

## 2023-02-05 MED ORDER — FENTANYL CITRATE (PF) 100 MCG/2ML IJ SOLN
INTRAMUSCULAR | Status: DC | PRN
  Administered 2023-02-05 (×2): 50 ug via INTRAVENOUS

## 2023-02-05 MED ORDER — EPHEDRINE SULFATE (PRESSORS) 50 MG/ML IJ SOLN
INTRAMUSCULAR | Status: DC | PRN
  Administered 2023-02-05: 5 mg via INTRAVENOUS

## 2023-02-05 MED ORDER — DEXAMETHASONE SODIUM PHOSPHATE 10 MG/ML IJ SOLN
INTRAMUSCULAR | Status: DC | PRN
  Administered 2023-02-05: 10 mg via INTRAVENOUS

## 2023-02-05 MED ORDER — CEFAZOLIN SODIUM-DEXTROSE 2-4 GM/100ML-% IV SOLN
2.0000 g | INTRAVENOUS | Status: AC
  Administered 2023-02-05: 2 g via INTRAVENOUS

## 2023-02-05 MED ORDER — MIDAZOLAM HCL 2 MG/2ML IJ SOLN
INTRAMUSCULAR | Status: DC | PRN
  Administered 2023-02-05: 2 mg via INTRAVENOUS

## 2023-02-05 MED ORDER — GLYCOPYRROLATE 0.2 MG/ML IJ SOLN
INTRAMUSCULAR | Status: DC | PRN
  Administered 2023-02-05: .2 mg via INTRAVENOUS

## 2023-02-05 MED ORDER — SUGAMMADEX SODIUM 200 MG/2ML IV SOLN
INTRAVENOUS | Status: DC | PRN
  Administered 2023-02-05: 200 mg via INTRAVENOUS

## 2023-02-05 MED ORDER — LIDOCAINE HCL URETHRAL/MUCOSAL 2 % EX GEL
CUTANEOUS | Status: AC
  Filled 2023-02-05: qty 5

## 2023-02-05 MED ORDER — FAMOTIDINE 20 MG PO TABS
ORAL_TABLET | ORAL | Status: AC
  Filled 2023-02-05: qty 1

## 2023-02-05 SURGICAL SUPPLY — 66 items
ADH SKN CLS APL DERMABOND .7 (GAUZE/BANDAGES/DRESSINGS) ×3
APL SRG 38 LTWT LNG FL B (MISCELLANEOUS)
APPLICATOR ARISTA FLEXITIP XL (MISCELLANEOUS) IMPLANT
BAG DRN RND TRDRP ANRFLXCHMBR (UROLOGICAL SUPPLIES) ×3
BAG URINE DRAIN 2000ML AR STRL (UROLOGICAL SUPPLIES) ×3 IMPLANT
BLADE SURG SZ11 CARB STEEL (BLADE) ×3 IMPLANT
CANNULA CAP OBTURATR AIRSEAL 8 (CAP) ×3 IMPLANT
CATH FOLEY 2WAY 5CC 16FR (CATHETERS) ×3
CATH ROBINSON RED A/P 16FR (CATHETERS) ×3 IMPLANT
CATH URTH 16FR FL 2W BLN LF (CATHETERS) ×3 IMPLANT
COUNTER NEEDLE 20/40 LG (NEEDLE) ×3 IMPLANT
COVER TIP SHEARS 8 DVNC (MISCELLANEOUS) ×3 IMPLANT
DERMABOND ADVANCED .7 DNX12 (GAUZE/BANDAGES/DRESSINGS) ×3 IMPLANT
DRAPE ARM DVNC X/XI (DISPOSABLE) ×12 IMPLANT
DRAPE COLUMN DVNC XI (DISPOSABLE) ×3 IMPLANT
DRAPE ROBOT W/ LEGGING 30X125 (DRAPES) ×3 IMPLANT
DRAPE SHEET LG 3/4 BI-LAMINATE (DRAPES) ×3 IMPLANT
ELECT REM PT RETURN 9FT ADLT (ELECTROSURGICAL) ×3
ELECTRODE REM PT RTRN 9FT ADLT (ELECTROSURGICAL) ×3 IMPLANT
FORCEPS BPLR R/ABLATION 8 DVNC (INSTRUMENTS) ×3 IMPLANT
GAUZE 4X4 16PLY ~~LOC~~+RFID DBL (SPONGE) ×3 IMPLANT
GLOVE BIO SURGEON STRL SZ7 (GLOVE) ×12 IMPLANT
GLOVE INDICATOR 7.5 STRL GRN (GLOVE) ×12 IMPLANT
GOWN STRL REUS W/ TWL LRG LVL3 (GOWN DISPOSABLE) ×18 IMPLANT
GOWN STRL REUS W/TWL LRG LVL3 (GOWN DISPOSABLE) ×18
HEMOSTAT ARISTA ABSORB 3G PWDR (HEMOSTASIS) IMPLANT
IRRIGATION STRYKERFLOW (MISCELLANEOUS) IMPLANT
IRRIGATOR STRYKERFLOW (MISCELLANEOUS)
IV LACTATED RINGERS 1000ML (IV SOLUTION) ×3 IMPLANT
IV NS 1000ML (IV SOLUTION) ×3
IV NS 1000ML BAXH (IV SOLUTION) IMPLANT
KIT PINK PAD W/HEAD ARE REST (MISCELLANEOUS) ×3
KIT PINK PAD W/HEAD ARM REST (MISCELLANEOUS) ×3 IMPLANT
LABEL OR SOLS (LABEL) ×3 IMPLANT
MANIFOLD NEPTUNE II (INSTRUMENTS) ×3 IMPLANT
MANIPULATOR VCARE LG CRV RETR (MISCELLANEOUS) IMPLANT
MANIPULATOR VCARE SML CRV RETR (MISCELLANEOUS) IMPLANT
MANIPULATOR VCARE STD CRV RETR (MISCELLANEOUS) IMPLANT
NDL DRIVE SUT CUT DVNC (INSTRUMENTS) ×3 IMPLANT
NEEDLE DRIVE SUT CUT DVNC (INSTRUMENTS) ×3
NS IRRIG 1000ML POUR BTL (IV SOLUTION) ×3 IMPLANT
NS IRRIG 500ML POUR BTL (IV SOLUTION) ×3 IMPLANT
OBTURATOR OPTICAL STND 8 DVNC (TROCAR) ×3
OBTURATOR OPTICALSTD 8 DVNC (TROCAR) ×3 IMPLANT
OCCLUDER COLPOPNEUMO (BALLOONS) ×3 IMPLANT
PACK GYN LAPAROSCOPIC (MISCELLANEOUS) ×3 IMPLANT
PAD OB MATERNITY 4.3X12.25 (PERSONAL CARE ITEMS) ×3 IMPLANT
PAD PREP OB/GYN DISP 24X41 (PERSONAL CARE ITEMS) ×3 IMPLANT
SCISSORS MNPLR CVD DVNC XI (INSTRUMENTS) ×3 IMPLANT
SCRUB CHG 4% DYNA-HEX 4OZ (MISCELLANEOUS) ×3 IMPLANT
SEAL UNIV 5-12 XI (MISCELLANEOUS) ×9 IMPLANT
SEALER VESSEL EXT DVNC XI (MISCELLANEOUS) ×3 IMPLANT
SET CYSTO W/LG BORE CLAMP LF (SET/KITS/TRAYS/PACK) ×3 IMPLANT
SET TUBE FILTERED XL AIRSEAL (SET/KITS/TRAYS/PACK) ×3 IMPLANT
SOL ELECTROSURG ANTI STICK (MISCELLANEOUS) ×3
SOL PREP PVP 2OZ (MISCELLANEOUS) ×3
SOLUTION ELECTROSURG ANTI STCK (MISCELLANEOUS) ×3 IMPLANT
SOLUTION PREP PVP 2OZ (MISCELLANEOUS) ×3 IMPLANT
SURGILUBE 2OZ TUBE FLIPTOP (MISCELLANEOUS) ×3 IMPLANT
SUT MNCRL 4-0 (SUTURE) ×3
SUT MNCRL 4-0 27XMFL (SUTURE) ×3
SUT VIC AB 0 CT2 27 (SUTURE) ×6 IMPLANT
SUT VLOC 90 2/L VL 12 GS22 (SUTURE) ×3 IMPLANT
SUTURE MNCRL 4-0 27XMF (SUTURE) ×3 IMPLANT
TRAP FLUID SMOKE EVACUATOR (MISCELLANEOUS) ×3 IMPLANT
WATER STERILE IRR 500ML POUR (IV SOLUTION) ×3 IMPLANT

## 2023-02-05 NOTE — Transfer of Care (Signed)
Immediate Anesthesia Transfer of Care Note  Patient: Virginia Mitchell  Procedure(s) Performed: XI ROBOTIC ASSISTED LAPAROSCOPIC HYSTERECTOMY AND SALPINGECTOMY, POSSIBLE PERINEAL REPAIR (Bilateral: Pelvis) CYSTOSCOPY (Bladder) REMOVAL OF NEXPLANON (Left: Arm Upper)  Patient Location: PACU  Anesthesia Type:General  Level of Consciousness: awake, drowsy, and patient cooperative  Airway & Oxygen Therapy: Patient Spontanous Breathing and Patient connected to face mask oxygen  Post-op Assessment: Report given to RN and Post -op Vital signs reviewed and stable  Post vital signs: Reviewed and stable  Last Vitals:  Vitals Value Taken Time  BP 108/70 02/05/23 1745  Temp 36.6 C 02/05/23 1745  Pulse 67 02/05/23 1746  Resp 14 02/05/23 1746  SpO2 100 % 02/05/23 1746  Vitals shown include unfiled device data.  Last Pain:  Vitals:   02/05/23 1001  TempSrc: Oral  PainSc: 0-No pain         Complications: No notable events documented.

## 2023-02-05 NOTE — Progress Notes (Signed)
   02/05/23 1500  Spiritual Encounters  Type of Visit Initial  Care provided to: Patient  Referral source Chaplain assessment  Reason for visit Routine spiritual support  OnCall Visit No   Chaplain visited with patient and provided compassionate presence prior to procedure. Chaplain spiritual support services remain available as the need arises.

## 2023-02-05 NOTE — Anesthesia Postprocedure Evaluation (Signed)
Anesthesia Post Note  Patient: KEYARRA RENDALL  Procedure(s) Performed: XI ROBOTIC ASSISTED LAPAROSCOPIC HYSTERECTOMY AND SALPINGECTOMY, POSSIBLE PERINEAL REPAIR (Bilateral: Pelvis) CYSTOSCOPY (Bladder) REMOVAL OF NEXPLANON (Left: Arm Upper)  Patient location during evaluation: PACU Anesthesia Type: General Level of consciousness: awake and alert Pain management: pain level controlled Vital Signs Assessment: post-procedure vital signs reviewed and stable Respiratory status: spontaneous breathing, nonlabored ventilation, respiratory function stable and patient connected to nasal cannula oxygen Cardiovascular status: blood pressure returned to baseline and stable Postop Assessment: no apparent nausea or vomiting Anesthetic complications: no  No notable events documented.   Last Vitals:  Vitals:   02/05/23 1830 02/05/23 1925  BP: 118/79 106/76  Pulse: 78 88  Resp: 14 14  Temp: (!) 36.4 C 36.6 C  SpO2: 99% 100%    Last Pain:  Vitals:   02/05/23 1925  TempSrc:   PainSc: 0-No pain                 Stephanie Coup

## 2023-02-05 NOTE — Interval H&P Note (Signed)
History and Physical Interval Note:  02/05/2023 2:28 PM  Virginia Mitchell  has presented today for surgery, with the diagnosis of pelvic pain, hydrosalpingx.  The various methods of treatment have been discussed with the patient and family. After consideration of risks, benefits and other options for treatment, the patient has consented to  Procedure(s): XI ROBOTIC ASSISTED LAPAROSCOPIC HYSTERECTOMY AND SALPINGECTOMY, POSSIBLE PERINEAL REPAIR (Bilateral) CYSTOSCOPY (N/A) REMOVAL OF NEXPLANON (N/A) as a surgical intervention.  The patient's history has been reviewed, patient examined, no change in status, stable for surgery.  I have reviewed the patient's chart and labs.  Questions were answered to the patient's satisfaction.     Christeen Douglas

## 2023-02-05 NOTE — Discharge Instructions (Addendum)
Discharge instructions after  robotically-assisted total laparoscopic hysterectomy   For the next three days, take ibuprofen and acetaminophen on a schedule, every 8 hours. You can take them together or you can intersperse them, and take one every four hours. I also gave you gabapentin for nighttime, to help you sleep and also to control pain. Take gabapentin medicines at night for at least the next 3 nights. You also have a narcotic, oxycodone, to take as needed if the above medicines don't help.  Postop constipation is a major cause of pain. Stay well hydrated, walk as you tolerate, and take over the counter senna as well as stool softeners if you need them.   Signs and Symptoms to Report Call our office at [phone] if you have any of the following.   Fever over 100.4 degrees or higher  Severe stomach pain not relieved with pain medications  Bright red bleeding that's heavier than a period that does not slow with rest  To go the bathroom a lot (frequency), you can't hold your urine (urgency), or it hurts when you empty your bladder (urinate)  Chest pain  Shortness of breath  Pain in the calves of your legs  Severe nausea and vomiting not relieved with anti-nausea medications  Signs of infection around your wounds, such as redness, hot to touch, swelling, green/yellow drainage (like pus), bad smelling discharge  Any concerns  What You Can Expect after Surgery  You may see some pink tinged, bloody fluid and bruising around the wound. This is normal.  You may notice shoulder and neck pain. This is caused by the gas used during surgery to expand your abdomen so your surgeon could get to the uterus easier.  You may have a sore throat because of the tube in your mouth during general anesthesia. This will go away in 2 to 3 days.  You may have some stomach cramps.  You may notice spotting on your panties.  You may have pain around the incision sites.   Activities after Your  Discharge Follow these guidelines to help speed your recovery at home:  Do the coughing and deep breathing as you did in the hospital for 2 weeks. Use the small blue breathing device, called the incentive spirometer for 2 weeks.  Don't drive if you are in pain or taking narcotic pain medicine. You may drive when you can safely slam on the brakes, turn the wheel forcefully, and rotate your torso comfortably. This is typically 1-2 weeks. Practice in a parking lot or side street prior to attempting to drive regularly.   Ask others to help with household chores for 4 weeks.  Do not lift anything heavier that 10 pounds for 4-6 weeks. This includes pets, children, and groceries.  Don't do strenuous activities, exercises, or sports like vacuuming, tennis, squash, etc. until your doctor says it is safe to do so. ---Maintain pelvic rest for 12 weeks. This means nothing in the vagina or rectum at all (no douching, tampons, intercourse) for 12 weeks.   Walk as you feel able. Rest often since it may take two or three weeks for your energy level to return to normal.   You may climb stairs  Avoid constipation:   -Eat fruits, vegetables, and whole grains. Eat small meals as your appetite will take time to return to normal.   -Drink 6 to 8 glasses of water each day unless your doctor has told you to limit your fluids.   -Use a laxative or   stool softener as needed if constipation becomes a problem. You may take Miralax, metamucil, Citrucil, Colace, Senekot, FiberCon, etc. If this does not relieve the constipation, try two tablespoons of Milk Of Magnesia every 8 hours until your bowels move.   You may shower. Gently wash the wounds with a mild soap and water. Pat dry.  Do not get in a hot tub, swimming pool, etc. for 6 weeks.  Do not use lotions, oils, powders on the wounds.  Do not douche, use tampons, or have sex until your doctor says it is okay.  Take your pain medicine when you need it. The medicine may not  work as well if the pain is bad.  Take the medicines you were taking before surgery. Other medications you will need are pain medications (Norco or Percocet) and nausea medications (Zofran).     AMBULATORY SURGERY  DISCHARGE INSTRUCTIONS   The drugs that you were given will stay in your system until tomorrow so for the next 24 hours you should not:  Drive an automobile Make any legal decisions Drink any alcoholic beverage   You may resume regular meals tomorrow.  Today it is better to start with liquids and gradually work up to solid foods.  You may eat anything you prefer, but it is better to start with liquids, then soup and crackers, and gradually work up to solid foods.   Please notify your doctor immediately if you have any unusual bleeding, trouble breathing, redness and pain at the surgery site, drainage, fever, or pain not relieved by medication.    Additional Instructions:   Please contact your physician with any problems or Same Day Surgery at [phone], Monday through Friday 6 am to 4 pm, or [place] at [facility] number at [phone].

## 2023-02-05 NOTE — Anesthesia Preprocedure Evaluation (Signed)
Anesthesia Evaluation  Patient identified by MRN, date of birth, ID band Patient awake    Reviewed: Allergy & Precautions, NPO status , Patient's Chart, lab work & pertinent test results  History of Anesthesia Complications Negative for: history of anesthetic complications  Airway Mallampati: III  TM Distance: >3 FB Neck ROM: full    Dental no notable dental hx. (+) Chipped, Dental Advidsory Given   Pulmonary neg pulmonary ROS, neg sleep apnea, neg COPD, Patient did not abstain from smoking., former smoker   Pulmonary exam normal breath sounds clear to auscultation       Cardiovascular Exercise Tolerance: Good (-) hypertension(-) CAD and (-) Past MI negative cardio ROS Normal cardiovascular exam(-) dysrhythmias  Rhythm:Regular Rate:Normal - Systolic murmurs    Neuro/Psych negative neurological ROS  negative psych ROS   GI/Hepatic negative GI ROS, Neg liver ROS,neg GERD  ,,  Endo/Other  negative endocrine ROSneg diabetes    Renal/GU      Musculoskeletal   Abdominal   Peds  Hematology negative hematology ROS (+)   Anesthesia Other Findings Past Medical History: No date: Anemia No date: Anxiety No date: COVID-19 No date: Headache No date: Hydrosalpinx No date: Ovarian cyst No date: Pelvic pain No date: Thalassemia trait No date: Uterine fibroid No date: Vertigo  Past Surgical History: No date: DILATION AND CURETTAGE OF UTERUS  BMI    Body Mass Index: 32.12 kg/m      Reproductive/Obstetrics negative OB ROS                             Anesthesia Physical Anesthesia Plan  ASA: 2  Anesthesia Plan: General ETT and General   Post-op Pain Management:    Induction: Intravenous  PONV Risk Score and Plan: 4 or greater and Ondansetron, Dexamethasone and Midazolam  Airway Management Planned: Oral ETT  Additional Equipment:   Intra-op Plan:   Post-operative Plan: Extubation  in OR  Informed Consent: I have reviewed the patients History and Physical, chart, labs and discussed the procedure including the risks, benefits and alternatives for the proposed anesthesia with the patient or authorized representative who has indicated his/her understanding and acceptance.     Dental Advisory Given  Plan Discussed with: Anesthesiologist, CRNA and Surgeon  Anesthesia Plan Comments: (Patient consented for risks of anesthesia including but not limited to:  - adverse reactions to medications - damage to eyes, teeth, lips or other oral mucosa - nerve damage due to positioning  - sore throat or hoarseness - Damage to heart, brain, nerves, lungs, other parts of body or loss of life  Patient voiced understanding.)       Anesthesia Quick Evaluation

## 2023-02-05 NOTE — Op Note (Signed)
Jacki Cones PROCEDURE DATE: 02/05/2023  PREOPERATIVE DIAGNOSIS: Pelvic pain POSTOPERATIVE DIAGNOSIS: The same, with possible interstitial cystitis PROCEDURE:  XI ROBOTIC ASSISTED LAPAROSCOPIC HYSTERECTOMY AND SALPINGECTOMY, POSSIBLE PERINEAL REPAIR:  CYSTOSCOPY: 52000 (CPT) REMOVAL OF NEXPLANON: 16109 (CPT) Cystoscopy with hydrodistention   SURGEON:  Dr. Christeen Douglas, MD ASSISTANT: CST, PA-S Elwyn Reach Anesthesiologist:  Anesthesiologist: Stephanie Coup, MD CRNA: Elmarie Mainland, CRNA; Morene Crocker, CRNA  INDICATIONS: 32 y.o. F  here for definitive surgical management secondary to the indications listed under preoperative diagnoses; please see preoperative note for further details.  Risks of surgery were discussed with the patient including but not limited to: bleeding which may require transfusion or reoperation; infection which may require antibiotics; injury to bowel, bladder, ureters or other surrounding organs; need for additional procedures; thromboembolic phenomenon, incisional problems and other postoperative/anesthesia complications. Written informed consent was obtained.    FINDINGS:    Nexplanon removed from right arm.  External genitalia, vaginal canal and cervix negative for lesions. No perineal repair needed. Intraoperative findings revealed a normal upper abdomen including bowel, liver, diaphragmatic surfaces, stomach, and omentum.   The uterus was small and mobile.  The right and left ovaries appeared normal.  Bilateral tubes appeared normal.  Bladder intact with 2 apparent Hunner's ulcers on the posterior wall, which is what led me to hydrodistend in the setting of pelvic pain  ANESTHESIA:    General INTRAVENOUS FLUIDS:600  ml ESTIMATED BLOOD LOSS:10 ml URINE OUTPUT: 350 ml  SPECIMENS: Uterus, cervix, bilateral fallopian tubes  COMPLICATIONS: None immediate  RATLH/BS:  PROCEDURE IN DETAIL: After informed consent was obtained, the patient was taken  to the operating room where general anesthesia was obtained without difficulty. Nexplanon removed in standard fashion from the right medial arm.  The patient was positioned in the dorsal lithotomy position in [place] stirrups and her arms were carefully tucked at her sides and the usual precautions were taken. Deep Trendelenburg (20-25 deg) was established to confirm that she does not shift on the table.  She was prepped and draped in normal sterile fashion.  Time-out was performed and a Foley catheter was placed into the bladder. A standard VCare uterine manipulator was then placed in the uterus without incident.  Preoperative prophylactic antibiotics were given through her iv.  After infiltration of local anesthetic at the proposed trocar sites, an 8 mm incision was created at the umbilicus, and an AirSeal 5mm was placed under direct visualization, after confirmation of OG tube working well. Pneumoperitoneum was created to a pressure of 15 mm Hg. The camera was placed and the abdomin surveyed, noting intact bowel below the site of entry. A survey of the pelvis and upper abdomen revealed the above findings. One right and one left lateral 8-mm robotic ports were placed under direct visualization.  The patient was placed in deepTrendelenburg and the bowel was displaced up into the upper abdomen. The robot was left side docked. The instruments were placed under direct visualization.   The ureters were identified bilaterally coursing outside of the operative field. Round ligaments were divided on each side with the EndoShears and the retroperitoneal space was opened bilaterally. The posterior leaflet of the broad was taken down to the level of the IP ligament. The anterior leaflet of the broad ligament was carefully taken down to the midline.  A bladder flap was created and the bladder was dissected down off the lower uterine segment and cervix using endoshears and electrocautery.  The Fallopian tubes were  divided from the ovaries,  and care taken to hemostatically transect the utero-ovarian ligament. The peritoneum was taken down to the level of the internal os, and the uterine arteries skeletonized. With strong cephalad pressure from the V-care, bipolar cautery was used to seal and transect the uterine arteries, and the pedicles allowed to fall away laterally.  A colpotomy was performed circumferentially along the V-Care ring with monopolar electrocautery and the cervix was incised from the vagina using the laparoscopic scissors. The specimen was removed through the vagina.  A pneumo balloon was placed in the vagina and the vaginal cuff was then closed in a running continuous fashion using the  0 V-Lock suture with careful attention to include the vaginal cuff angles, the uterosacral ligaments and the vaginal mucosa within the closure.  Hemostasis was secured with intraabdominal pressure and review of all surgical sites. The intraperitoneal pressure was dropped, and all planes of dissection, vascular pedicles and the vaginal cuff were found to be hemostatic.  The robot was undocked.   Attention was turned to the bladder and cystoscopy showed vigorous bilateral ureteral jets.  No stitches were visualized in the bladder during cystoscopy. 2 areas appearing to be Hunner's ulcers were visualized, and the bladder then filled with for 3 min for hydrdistention treatment of possible interstitial cystitis.  The lateral trocars were removed under visualization.  The CO2 gas was released and several deep breaths given to remove any remaining CO2 from the peritoneal cavity.  The skin incisions were closed with 4-0 Monocryl subcuticular stitch and Dermabond.    Anesthesia was reversed without difficulty.  The patient tolerated the procedure well.  Sponge, lap and needle counts were correct x2.  The patient was taken to recovery room in excellent condition.

## 2023-02-05 NOTE — Anesthesia Procedure Notes (Signed)
Procedure Name: Intubation Date/Time: 02/05/2023 3:51 PM  Performed by: Morene Crocker, CRNAPre-anesthesia Checklist: Patient identified, Patient being monitored, Timeout performed, Emergency Drugs available and Suction available Patient Re-evaluated:Patient Re-evaluated prior to induction Oxygen Delivery Method: Circle system utilized Preoxygenation: Pre-oxygenation with 100% oxygen Induction Type: IV induction Ventilation: Mask ventilation without difficulty Laryngoscope Size: 3 and McGraph Grade View: Grade I Tube type: Oral Tube size: 7.0 mm Number of attempts: 1 Airway Equipment and Method: Stylet Placement Confirmation: ETT inserted through vocal cords under direct vision, positive ETCO2 and breath sounds checked- equal and bilateral Secured at: 22 cm Tube secured with: Tape Dental Injury: Teeth and Oropharynx as per pre-operative assessment  Comments: Smooth atraumatic intubation, no complications noted

## 2023-02-07 ENCOUNTER — Other Ambulatory Visit: Payer: Self-pay

## 2023-02-10 ENCOUNTER — Encounter: Payer: Self-pay | Admitting: Obstetrics and Gynecology

## 2023-02-10 LAB — SURGICAL PATHOLOGY

## 2023-02-11 IMAGING — US US BREAST*R* LIMITED INC AXILLA
1 series · 7 of 7 positions shown · non-contrast
Comparison: Previous exam(s).

CLINICAL DATA: Patient with recurrent RIGHT breast
abscess/phlegmon. Patient is currently pregnant. Patient reports
recurring issues with wound control.

EXAM:
ULTRASOUND OF THE RIGHT BREAST

[Series 1: [facility] right inc axilla · 7 of 7 slices shown]
[im 1/7]
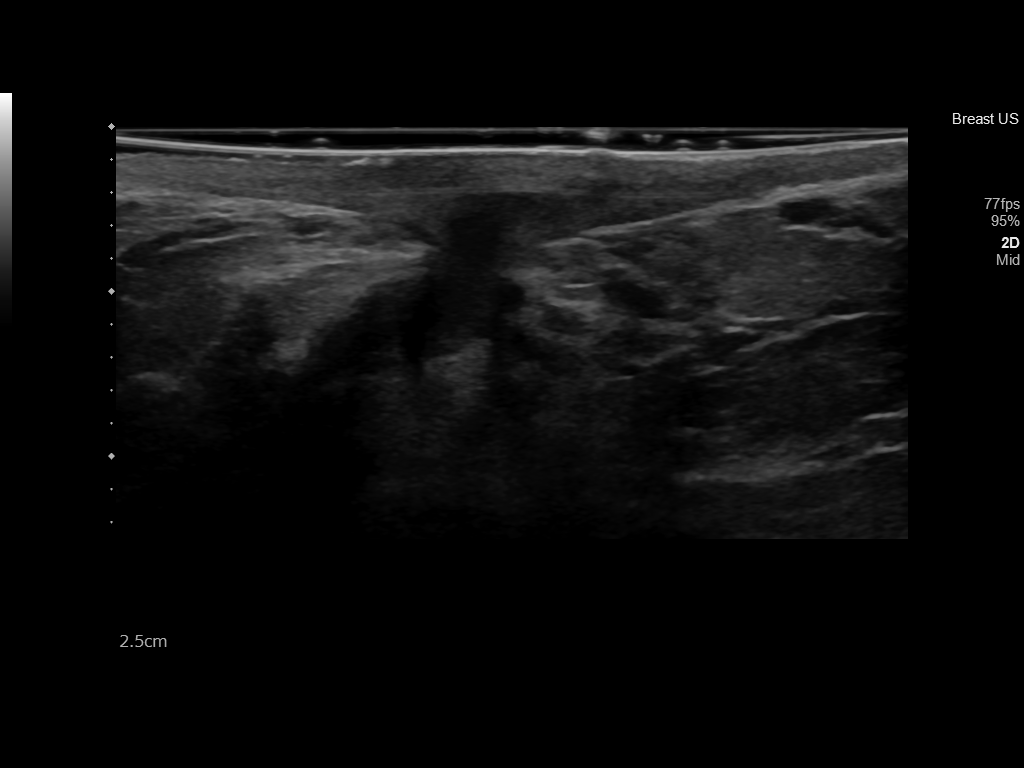
[im 2/7]
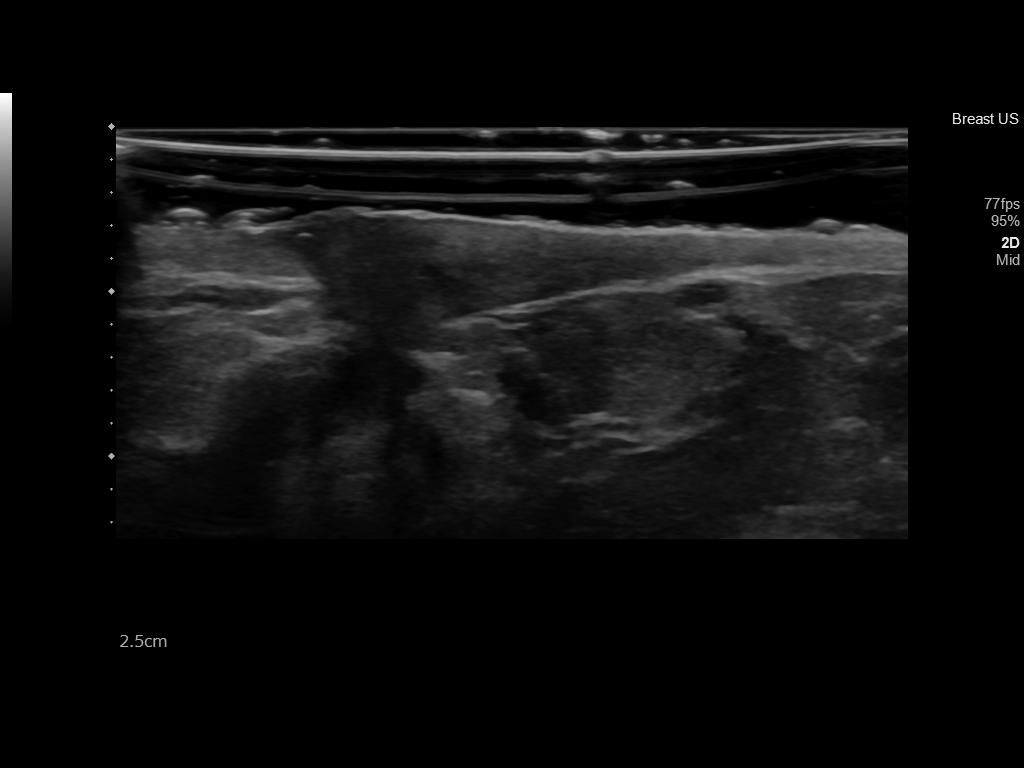
[im 3/7]
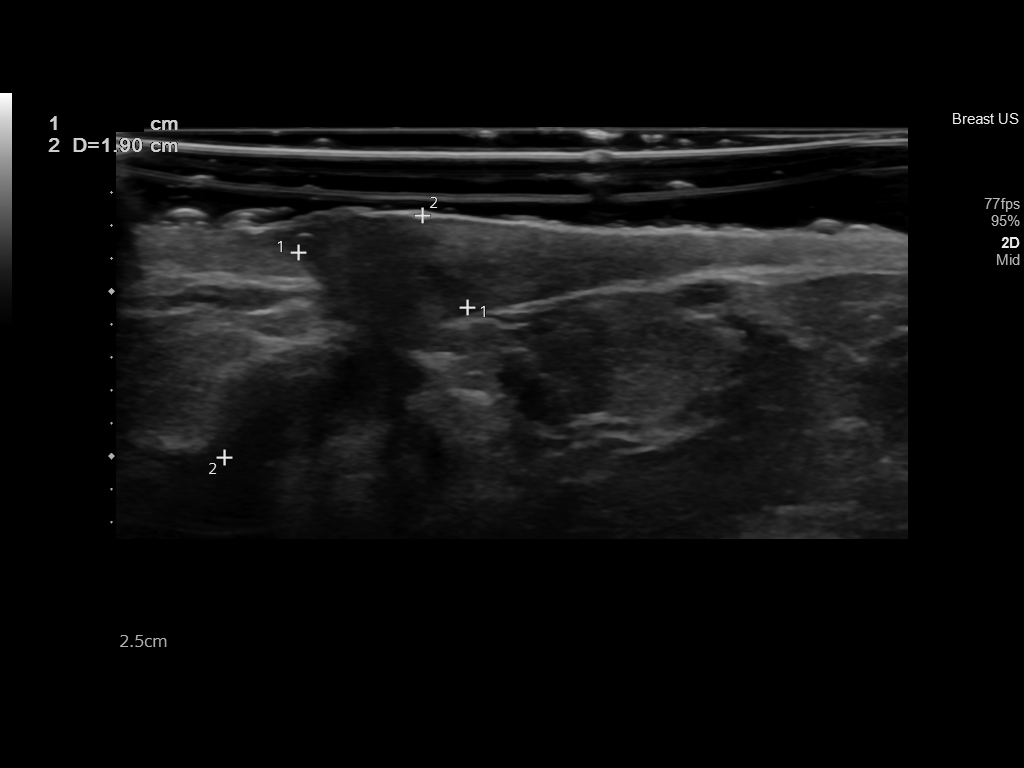
[im 4/7]
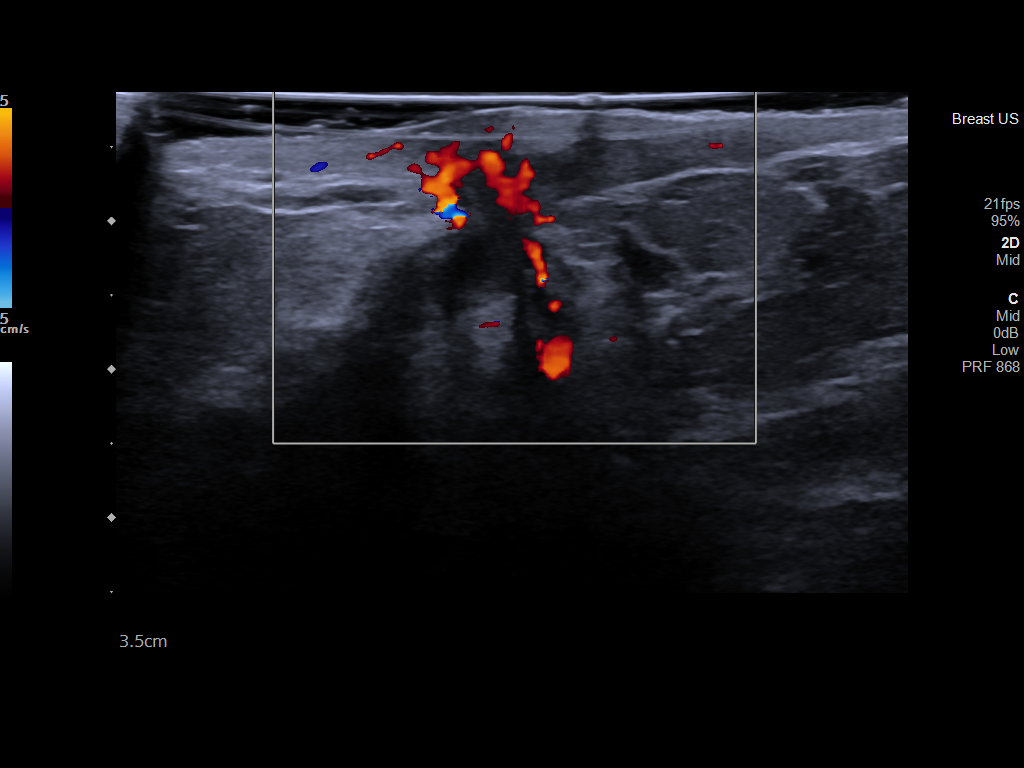
[im 5/7]
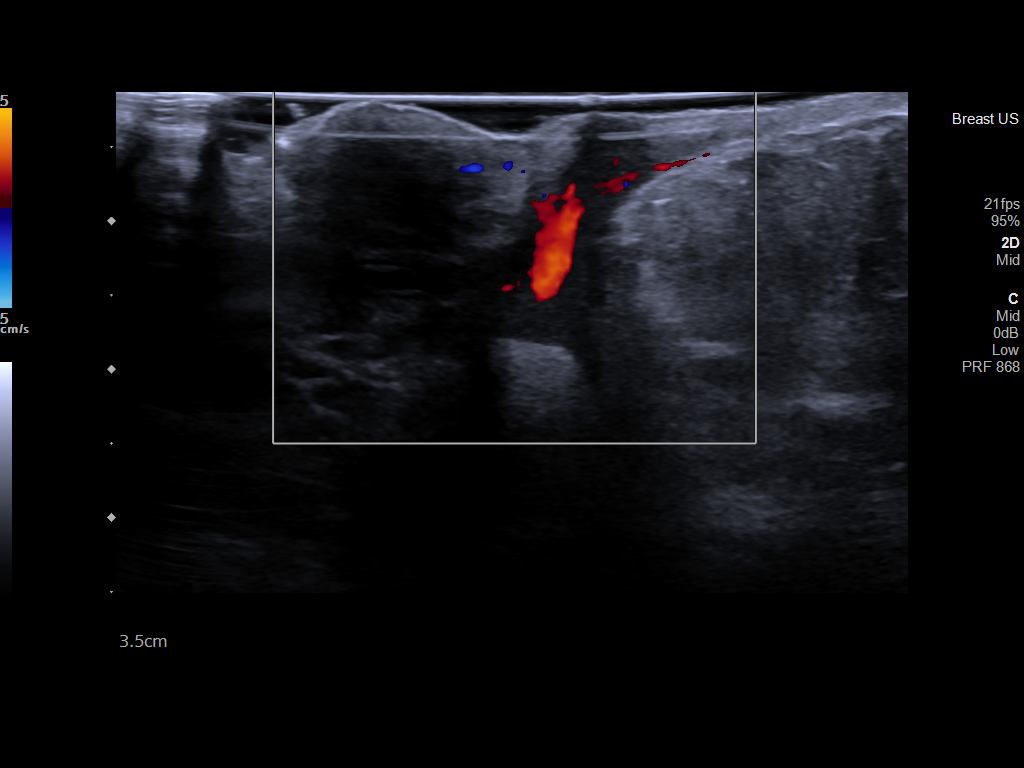
[im 6/7]
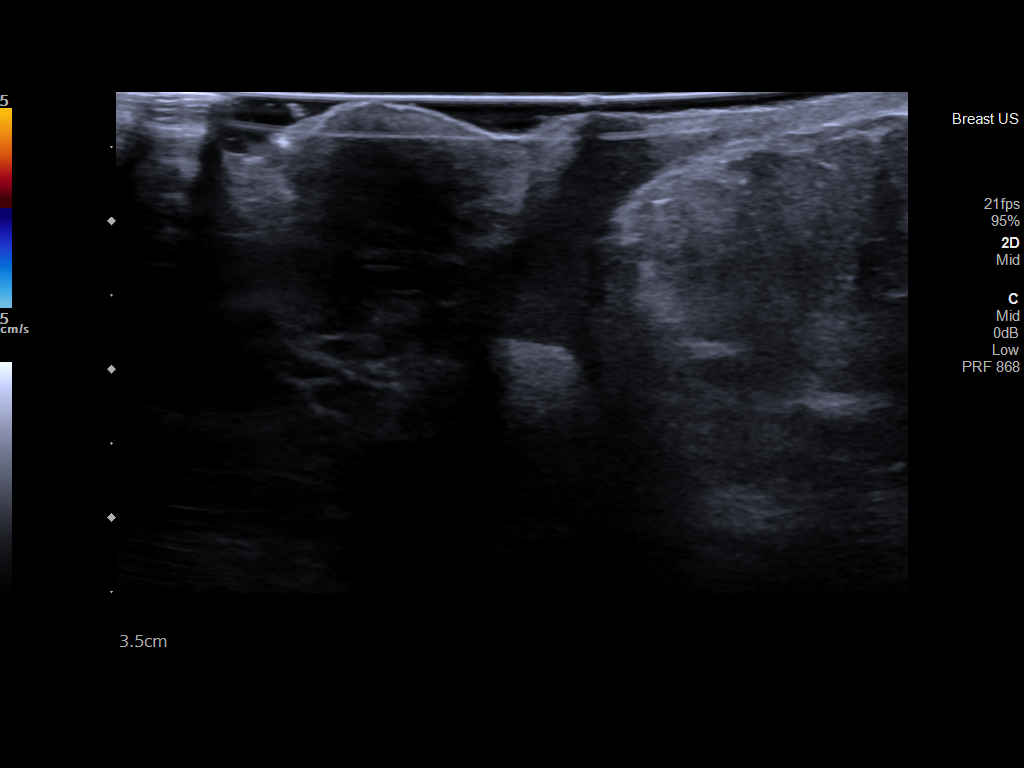
[im 7/7]
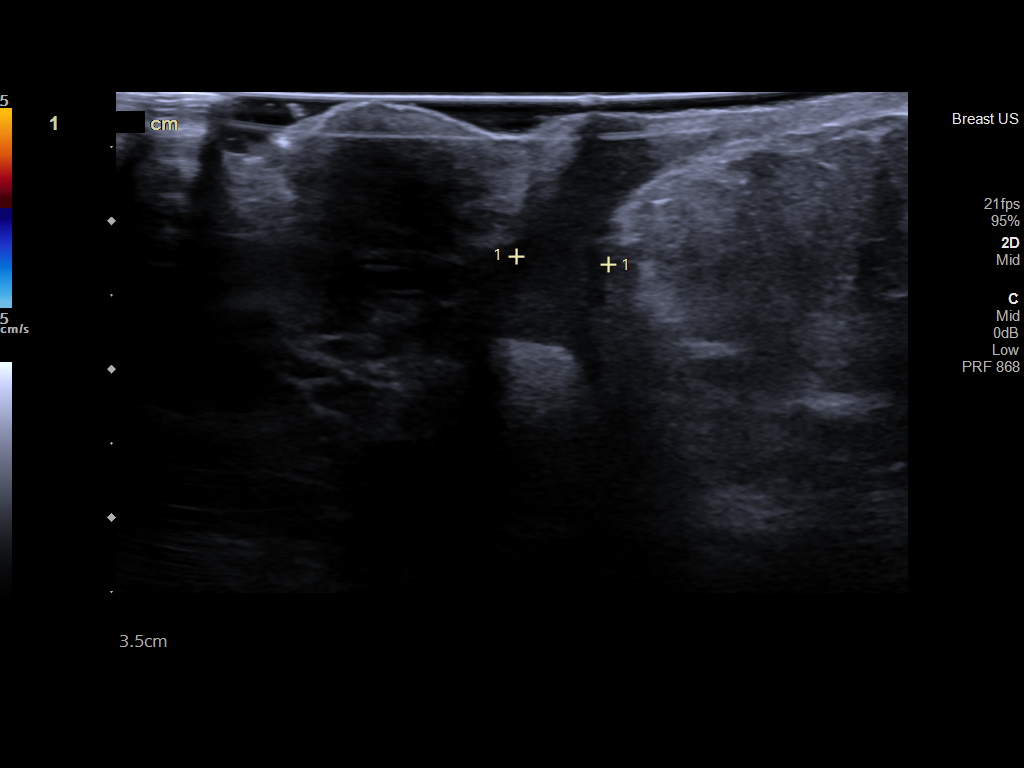

[7 of 7 positions shown; findings below may reference images not displayed]

FINDINGS: On physical exam, there is a superficial poorly healing wound at the
site of concern in the retroareolar breast.

Targeted ultrasound was performed of the retroareolar breast. At 6
o'clock in the retroareolar breast at the site of concern, there is
a hypoechoic superficial collection with internal blood flow which
measures 6 by 16 by 10 mm. Overall this is much improved in
comparison to prior ultrasound from October 17, 2020 and is mildly
improved since prior ultrasound dated November 19, 2020.
IMPRESSION: 1. There are phlegmonous changes in the RIGHT retroareolar breast at
site of poorly healing wound. Overall, this is improved in
comparison to prior. Recommend exquisite wound care with potential
consultation of a wound care specialist given gravid state and risk
of a milk fistula. Recommend continued close clinical follow-up to
resolution with antibiotic treatment as needed.

RECOMMENDATION:
Recommend exquisite wound care with potential consultation of a
wound care specialist given gravid state and risk of a milk fistula.
Recommend continued close clinical follow-up to resolution with
antibiotic treatment as needed.

I have discussed the findings and recommendations with the patient.
If applicable, a reminder letter will be sent to the patient
regarding the next appointment.

BI-RADS CATEGORY  2: Benign.

## 2024-03-08 ENCOUNTER — Other Ambulatory Visit: Payer: Self-pay | Admitting: Obstetrics and Gynecology

## 2024-03-08 DIAGNOSIS — N63 Unspecified lump in unspecified breast: Secondary | ICD-10-CM

## 2024-03-10 ENCOUNTER — Ambulatory Visit
Admission: RE | Admit: 2024-03-10 | Discharge: 2024-03-10 | Disposition: A | Discharge status: Home / Self Care | Source: Ambulatory Visit | Attending: Obstetrics and Gynecology | Admitting: Obstetrics and Gynecology

## 2024-03-10 ENCOUNTER — Inpatient Hospital Stay
Admission: RE | Admit: 2024-03-10 | Discharge: 2024-03-10 | Attending: Obstetrics and Gynecology | Admitting: Obstetrics and Gynecology

## 2024-03-10 DIAGNOSIS — N63 Unspecified lump in unspecified breast: Secondary | ICD-10-CM
